# Patient Record
Sex: Male | Born: 1990 | Race: White | Hispanic: No | Marital: Married | Smoking: Former smoker
Health system: Southern US, Community
[De-identification: ages and names within clinical notes are randomized; demographics above are authoritative.]

## PROBLEM LIST (undated history)

## (undated) DIAGNOSIS — G4730 Sleep apnea, unspecified: Secondary | ICD-10-CM

## (undated) HISTORY — PX: VASECTOMY: SHX75

## (undated) HISTORY — DX: Sleep apnea, unspecified: G47.30

---

## 2017-01-15 ENCOUNTER — Other Ambulatory Visit (HOSPITAL_COMMUNITY): Payer: Self-pay | Admitting: General Surgery

## 2017-01-25 ENCOUNTER — Ambulatory Visit (HOSPITAL_COMMUNITY)
Admission: RE | Admit: 2017-01-25 | Discharge: 2017-01-25 | Disposition: A | Discharge status: Home / Self Care | Payer: 59 | Source: Ambulatory Visit | Attending: General Surgery | Admitting: General Surgery

## 2017-02-04 ENCOUNTER — Encounter: Payer: 59 | Attending: General Surgery | Admitting: Skilled Nursing Facility1

## 2017-02-04 ENCOUNTER — Encounter: Payer: Self-pay | Admitting: Skilled Nursing Facility1

## 2017-02-04 DIAGNOSIS — Z713 Dietary counseling and surveillance: Secondary | ICD-10-CM | POA: Diagnosis not present

## 2017-02-04 DIAGNOSIS — I10 Essential (primary) hypertension: Secondary | ICD-10-CM | POA: Diagnosis not present

## 2017-02-04 DIAGNOSIS — Z6839 Body mass index (BMI) 39.0-39.9, adult: Secondary | ICD-10-CM | POA: Insufficient documentation

## 2017-02-04 DIAGNOSIS — E669 Obesity, unspecified: Secondary | ICD-10-CM

## 2017-02-04 DIAGNOSIS — G4733 Obstructive sleep apnea (adult) (pediatric): Secondary | ICD-10-CM | POA: Diagnosis not present

## 2017-02-04 DIAGNOSIS — Z8249 Family history of ischemic heart disease and other diseases of the circulatory system: Secondary | ICD-10-CM | POA: Diagnosis not present

## 2017-02-04 DIAGNOSIS — Z8349 Family history of other endocrine, nutritional and metabolic diseases: Secondary | ICD-10-CM | POA: Diagnosis not present

## 2017-02-04 DIAGNOSIS — M545 Low back pain: Secondary | ICD-10-CM | POA: Insufficient documentation

## 2017-02-04 DIAGNOSIS — Z87891 Personal history of nicotine dependence: Secondary | ICD-10-CM | POA: Diagnosis not present

## 2017-02-04 DIAGNOSIS — E7800 Pure hypercholesterolemia, unspecified: Secondary | ICD-10-CM | POA: Diagnosis not present

## 2017-02-04 DIAGNOSIS — Z8371 Family history of colonic polyps: Secondary | ICD-10-CM | POA: Insufficient documentation

## 2017-02-04 DIAGNOSIS — K760 Fatty (change of) liver, not elsewhere classified: Secondary | ICD-10-CM | POA: Insufficient documentation

## 2017-02-04 DIAGNOSIS — Z811 Family history of alcohol abuse and dependence: Secondary | ICD-10-CM | POA: Insufficient documentation

## 2017-02-04 NOTE — Progress Notes (Signed)
Pre-Op Assessment Visit:  Pre-Operative Sleeve or RYGB Surgery  Medical Nutrition Therapy:  Appt start time: 1:58  End time:  2:49  Patient was seen on 02/04/2017 for Pre-Operative Nutrition Assessment. Assessment and [facility] coordinator on 02/04/2017.   Pt has yet to determine which surgery he wants preformed. Pt states he has adopted his niece and nephew and his wife has had the RYGB. Pt states he likes to drink randomly when he is feeling stressed and will drink 1 whole bottle of wine: Pt was educated on the research surrounding alcoholism at about 2 years from surgery. 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   Pt expectation of surgery: different mindset of food and make me not feel hungry and help control my portion sizes and live a healthier life   Pt expectation of Dietitian:  To help me with portion control and which foods to eat more of and less of  Start weight at [facility]: 271.1 BMI: 39.8  24 hr Dietary Recall: First Meal: skipped---eggs and bacon and hash browns or corn beef hash Snack:  Second Meal: sandwich and chips or fast food Snack:  Third Meal: spaghetti or chicken or steak and edemema and corn or peas  Snack: chips and dip or candy Beverages: diet soda, water  Encouraged to engage in 150 minutes of moderate physical activity including cardiovascular and weight baring weekly  Handouts given during visit include:  . Pre-Op Goals . Bariatric Surgery Protein Shakes During the appointment today the following Pre-Op Goals were reviewed with the patient: . Maintain or lose weight as instructed by your surgeon . Make healthy food choices . Begin to limit portion sizes . Limited concentrated sugars and fried foods . Keep fat/sugar in the single digits per serving on             food labels . Practice CHEWING your food  (aim for 30 chews per bite or until applesauce  consistency) . Practice not drinking 15 minutes before, during, and 30 minutes after each meal/snack . Avoid all carbonated beverages  . Avoid/limit caffeinated beverages  . Avoid all sugar-sweetened beverages . Consume 3 meals per day; eat every 3-5 hours . Make a list of non-food related activities . Aim for 64-100 ounces of FLUID daily  . Aim for at least 60-80 grams of PROTEIN daily . Look for a liquid protein source that contain ?15 g protein and ?5 g carbohydrate  (ex: shakes, drinks, shots)  -Follow diet recommendations listed below   Energy and Macronutrient Recomendations: Calories: 1600 Carbohydrate: 180 Protein: 120 Fat: 44  Demonstrated degree of understanding via:  Teach Back  Teaching Method Utilized:  Visual Auditory Hands on  Barriers to learning/adherence to lifestyle change: none identified  Patient to call the [facility] to enroll in Pre-Op and Post-Op Nutrition Education when surgery date is scheduled.

## 2017-03-08 ENCOUNTER — Encounter: Payer: 59 | Attending: General Surgery | Admitting: Skilled Nursing Facility1

## 2017-03-08 DIAGNOSIS — K760 Fatty (change of) liver, not elsewhere classified: Secondary | ICD-10-CM | POA: Diagnosis not present

## 2017-03-08 DIAGNOSIS — Z8249 Family history of ischemic heart disease and other diseases of the circulatory system: Secondary | ICD-10-CM | POA: Diagnosis not present

## 2017-03-08 DIAGNOSIS — E7800 Pure hypercholesterolemia, unspecified: Secondary | ICD-10-CM | POA: Insufficient documentation

## 2017-03-08 DIAGNOSIS — Z8349 Family history of other endocrine, nutritional and metabolic diseases: Secondary | ICD-10-CM | POA: Diagnosis not present

## 2017-03-08 DIAGNOSIS — Z87891 Personal history of nicotine dependence: Secondary | ICD-10-CM | POA: Diagnosis not present

## 2017-03-08 DIAGNOSIS — M545 Low back pain: Secondary | ICD-10-CM | POA: Insufficient documentation

## 2017-03-08 DIAGNOSIS — Z713 Dietary counseling and surveillance: Secondary | ICD-10-CM | POA: Diagnosis not present

## 2017-03-08 DIAGNOSIS — Z8371 Family history of colonic polyps: Secondary | ICD-10-CM | POA: Insufficient documentation

## 2017-03-08 DIAGNOSIS — Z6839 Body mass index (BMI) 39.0-39.9, adult: Secondary | ICD-10-CM | POA: Diagnosis not present

## 2017-03-08 DIAGNOSIS — G4733 Obstructive sleep apnea (adult) (pediatric): Secondary | ICD-10-CM | POA: Diagnosis not present

## 2017-03-08 DIAGNOSIS — E669 Obesity, unspecified: Secondary | ICD-10-CM

## 2017-03-08 DIAGNOSIS — Z811 Family history of alcohol abuse and dependence: Secondary | ICD-10-CM | POA: Diagnosis not present

## 2017-03-08 DIAGNOSIS — I10 Essential (primary) hypertension: Secondary | ICD-10-CM | POA: Diagnosis not present

## 2017-03-08 NOTE — Progress Notes (Signed)
RYGB  Assessment:   1st SWL Appointment.   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   [place]. Pt states he has started a blood pressure medication (lisinopril). Pt states he has cut his carbonated beverages to 2 a week. 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 Pt states he works second shift. Pt states he has been trying different protein water.   Start weight at [facility]: 271.1 Wt: 272.1 BMI: 39.95  MEDICATIONS: See List   DIETARY INTAKE:  24-hr recall:  B ( AM): skipped Snk ( AM):  L ( PM): lunch meat and cheese roll up and nuts Snk ( PM): yogurt and strawberries  D ( PM): tacos with cheese shell instead of tortilla  Snk ( PM):  Beverages: water with flavorings, some clear protein drinks, diet soda, 1-2 glasses of wine  Usual physical activity: trying to do 30 minutes a day, trying to move more  Diet to Follow: 1600 calories 180 g carbohydrates 120 g protein 44 g fat   Nutritional Diagnosis:  [place]-3.3 Overweight/obesity related to past poor dietary habits and physical inactivity as evidenced by patient w/ planned RYGB surgery following dietary guidelines for continued weight loss.    Intervention:  Nutrition counseling for upcoming Bariatric Surgery. Goals: -Encouraged to engage in 150 minutes of moderate physical activity including cardiovascular and weight baring weekly -Have something within the first hour of waking  -Avoid alcohol -Avoid caffeine   Teaching Method Utilized:  Visual Auditory Hands on  Barriers to learning/adherence to lifestyle change: none identified   Demonstrated degree of understanding via:  Teach Back   Monitoring/Evaluation:  Dietary intake, exercise, and body weight prn.

## 2017-03-08 NOTE — Patient Instructions (Addendum)
-  Have something within the first hour of waking   -Avoid alcohol  -Avoid caffeine

## 2017-04-07 ENCOUNTER — Encounter: Payer: Self-pay | Admitting: Skilled Nursing Facility1

## 2017-04-07 ENCOUNTER — Encounter: Payer: 59 | Attending: General Surgery | Admitting: Skilled Nursing Facility1

## 2017-04-07 DIAGNOSIS — M545 Low back pain: Secondary | ICD-10-CM | POA: Diagnosis not present

## 2017-04-07 DIAGNOSIS — Z8371 Family history of colonic polyps: Secondary | ICD-10-CM | POA: Diagnosis not present

## 2017-04-07 DIAGNOSIS — Z87891 Personal history of nicotine dependence: Secondary | ICD-10-CM | POA: Insufficient documentation

## 2017-04-07 DIAGNOSIS — K760 Fatty (change of) liver, not elsewhere classified: Secondary | ICD-10-CM | POA: Insufficient documentation

## 2017-04-07 DIAGNOSIS — Z811 Family history of alcohol abuse and dependence: Secondary | ICD-10-CM | POA: Insufficient documentation

## 2017-04-07 DIAGNOSIS — Z6839 Body mass index (BMI) 39.0-39.9, adult: Secondary | ICD-10-CM | POA: Insufficient documentation

## 2017-04-07 DIAGNOSIS — E669 Obesity, unspecified: Secondary | ICD-10-CM

## 2017-04-07 DIAGNOSIS — E7800 Pure hypercholesterolemia, unspecified: Secondary | ICD-10-CM | POA: Insufficient documentation

## 2017-04-07 DIAGNOSIS — Z713 Dietary counseling and surveillance: Secondary | ICD-10-CM | POA: Insufficient documentation

## 2017-04-07 DIAGNOSIS — G4733 Obstructive sleep apnea (adult) (pediatric): Secondary | ICD-10-CM | POA: Insufficient documentation

## 2017-04-07 DIAGNOSIS — Z8349 Family history of other endocrine, nutritional and metabolic diseases: Secondary | ICD-10-CM | POA: Diagnosis not present

## 2017-04-07 DIAGNOSIS — Z8249 Family history of ischemic heart disease and other diseases of the circulatory system: Secondary | ICD-10-CM | POA: Insufficient documentation

## 2017-04-07 DIAGNOSIS — I10 Essential (primary) hypertension: Secondary | ICD-10-CM | POA: Insufficient documentation

## 2017-04-07 NOTE — Progress Notes (Signed)
RYGB  Assessment:   2nd SWL Appointment.   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   Pt states he [place] in [place]. Pt states he has started a blood pressure medication (lisinopril). Pt states he has cut his carbonated beverages to 2 a week. 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 Pt states he works second shift.   Pt arrives having gained about 2 pounds. Pt states he has not had any alcohol. Pt states he stills struggles getting in breakfast. Pt states he has been using smaller plates to help with portion sizes. Pt state he needs to work on not feeling guilty about the food he eats. Pt states he has been working on less carbonated drinks. Pt states he has tried some protein shakes that he likes okay.   Start weight at [facility]: 271.1 Wt: 274 BMI: 40.32  MEDICATIONS: See List   DIETARY INTAKE:  24-hr recall:  B ( AM): skipped---cheese stick yogurt and lunch meat Snk ( AM):  L ( PM): cheese shell taco with meat Snk ( PM): yogurt and strawberries---almonds or nuts D ( PM): hamburger helper   Snk ( PM):  Beverages: water with flavorings, some clear protein drinks, diet soda, fairlife milk or carb master milk  Usual physical activity: trying to do 30 minutes a day, trying to move more  Diet to Follow: 1600 calories 180 g carbohydrates 120 g protein 44 g fat   Nutritional Diagnosis:  [place]-3.3 Overweight/obesity related to past poor dietary habits and physical inactivity as evidenced by patient w/ planned RYGB surgery following dietary guidelines for continued weight loss.    Intervention:  Nutrition counseling for upcoming Bariatric Surgery. Goals: -Encouraged to engage in 150 minutes of moderate physical activity including cardiovascular and weight baring weekly -Have something within the first hour of waking  -Avoid carbonation  -Work on well balanced meals: use the idea sheet  Teaching Method Utilized:  Visual Auditory Hands on  Barriers to  learning/adherence to lifestyle change: none identified   Demonstrated degree of understanding via:  Teach Back   Monitoring/Evaluation:  Dietary intake, exercise, and body weight prn.

## 2017-05-05 ENCOUNTER — Encounter: Payer: 59 | Attending: General Surgery | Admitting: Skilled Nursing Facility1

## 2017-05-05 ENCOUNTER — Encounter: Payer: Self-pay | Admitting: Skilled Nursing Facility1

## 2017-05-05 DIAGNOSIS — K760 Fatty (change of) liver, not elsewhere classified: Secondary | ICD-10-CM | POA: Diagnosis not present

## 2017-05-05 DIAGNOSIS — M545 Low back pain: Secondary | ICD-10-CM | POA: Diagnosis not present

## 2017-05-05 DIAGNOSIS — E7800 Pure hypercholesterolemia, unspecified: Secondary | ICD-10-CM | POA: Diagnosis not present

## 2017-05-05 DIAGNOSIS — E669 Obesity, unspecified: Secondary | ICD-10-CM

## 2017-05-05 DIAGNOSIS — Z8371 Family history of colonic polyps: Secondary | ICD-10-CM | POA: Insufficient documentation

## 2017-05-05 DIAGNOSIS — G4733 Obstructive sleep apnea (adult) (pediatric): Secondary | ICD-10-CM | POA: Insufficient documentation

## 2017-05-05 DIAGNOSIS — Z811 Family history of alcohol abuse and dependence: Secondary | ICD-10-CM | POA: Diagnosis not present

## 2017-05-05 DIAGNOSIS — Z8349 Family history of other endocrine, nutritional and metabolic diseases: Secondary | ICD-10-CM | POA: Diagnosis not present

## 2017-05-05 DIAGNOSIS — I10 Essential (primary) hypertension: Secondary | ICD-10-CM | POA: Insufficient documentation

## 2017-05-05 DIAGNOSIS — Z87891 Personal history of nicotine dependence: Secondary | ICD-10-CM | POA: Diagnosis not present

## 2017-05-05 DIAGNOSIS — Z8249 Family history of ischemic heart disease and other diseases of the circulatory system: Secondary | ICD-10-CM | POA: Insufficient documentation

## 2017-05-05 DIAGNOSIS — Z6839 Body mass index (BMI) 39.0-39.9, adult: Secondary | ICD-10-CM | POA: Insufficient documentation

## 2017-05-05 DIAGNOSIS — Z713 Dietary counseling and surveillance: Secondary | ICD-10-CM | POA: Insufficient documentation

## 2017-05-05 NOTE — Progress Notes (Signed)
RYGB  Assessment:  3rd SWL Appointment.   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   Pt [place]. Pt states he has started a blood pressure medication (lisinopril). Pt states he has cut his carbonated beverages to 2 a week. 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 Pt states he works second shift.   Pt arrives having lost about 1 pound. [place] Pt states he is Ready to embrace this new life style. Pt states he has 5 children.   Start weight at [facility]: 271.1 Wt: 273.4 BMI: 40.14  MEDICATIONS: See List   DIETARY INTAKE:  24-hr recall:  B ( AM): yogurt or p3 Snk ( AM): nuts L ( PM): cheese shell taco with meat---carrots and ham or chicken  Snk ( PM): potato wedges with ham D ( PM): chicken and bacon or roast beef with carrots and potatoes  Snk ( PM):  Beverages: water with flavorings, propel, some clear protein drinks, diet soda, fairlife milk or carb master milk  Usual physical activity: trying to do 30 minutes a day and [id number] steps   Diet to Follow: 1600 calories 180 g carbohydrates 120 g protein 44 g fat   Nutritional Diagnosis:  [place]-3.3 Overweight/obesity related to past poor dietary habits and physical inactivity as evidenced by patient w/ planned RYGB surgery following dietary guidelines for continued weight loss.    Intervention:  Nutrition counseling for upcoming Bariatric Surgery. Goals: -Encouraged to engage in 150 minutes of moderate physical activity including cardiovascular and weight baring weekly -Keep up the great work!  Teaching Method Utilized:  Visual Auditory Hands on  Barriers to learning/adherence to lifestyle change: none identified   Demonstrated degree of understanding via:  Teach Back   Monitoring/Evaluation:  Dietary intake, exercise, and body weight prn.

## 2017-05-11 ENCOUNTER — Ambulatory Visit (INDEPENDENT_AMBULATORY_CARE_PROVIDER_SITE_OTHER): Payer: 59 | Admitting: Psychiatry

## 2017-05-11 DIAGNOSIS — F509 Eating disorder, unspecified: Secondary | ICD-10-CM | POA: Diagnosis not present

## 2017-05-17 ENCOUNTER — Encounter: Payer: 59 | Attending: General Surgery | Admitting: Registered"

## 2017-05-17 DIAGNOSIS — E669 Obesity, unspecified: Secondary | ICD-10-CM

## 2017-05-17 DIAGNOSIS — Z87891 Personal history of nicotine dependence: Secondary | ICD-10-CM | POA: Diagnosis not present

## 2017-05-17 DIAGNOSIS — K760 Fatty (change of) liver, not elsewhere classified: Secondary | ICD-10-CM | POA: Insufficient documentation

## 2017-05-17 DIAGNOSIS — Z811 Family history of alcohol abuse and dependence: Secondary | ICD-10-CM | POA: Insufficient documentation

## 2017-05-17 DIAGNOSIS — I10 Essential (primary) hypertension: Secondary | ICD-10-CM | POA: Diagnosis not present

## 2017-05-17 DIAGNOSIS — M545 Low back pain: Secondary | ICD-10-CM | POA: Diagnosis not present

## 2017-05-17 DIAGNOSIS — Z8249 Family history of ischemic heart disease and other diseases of the circulatory system: Secondary | ICD-10-CM | POA: Diagnosis not present

## 2017-05-17 DIAGNOSIS — Z6839 Body mass index (BMI) 39.0-39.9, adult: Secondary | ICD-10-CM | POA: Insufficient documentation

## 2017-05-17 DIAGNOSIS — Z713 Dietary counseling and surveillance: Secondary | ICD-10-CM | POA: Insufficient documentation

## 2017-05-17 DIAGNOSIS — E7800 Pure hypercholesterolemia, unspecified: Secondary | ICD-10-CM | POA: Diagnosis not present

## 2017-05-17 DIAGNOSIS — Z8371 Family history of colonic polyps: Secondary | ICD-10-CM | POA: Insufficient documentation

## 2017-05-17 DIAGNOSIS — G4733 Obstructive sleep apnea (adult) (pediatric): Secondary | ICD-10-CM | POA: Diagnosis not present

## 2017-05-17 DIAGNOSIS — Z8349 Family history of other endocrine, nutritional and metabolic diseases: Secondary | ICD-10-CM | POA: Insufficient documentation

## 2017-05-17 NOTE — Progress Notes (Signed)
  Pre-Operative Nutrition Class:  Appt start time: 8:15   End time:  9:15.  Patient was seen on 05/17/2017 for Pre-Operative Bariatric Surgery Education [facility].   Surgery date: TBD Surgery type: RYGB Start weight at [facility]: 271.1 Weight today: 280.0  Samples given per MNT protocol. Patient educated on appropriate usage: Bariatric Advantage Multivitamin Lot # [id number] Exp: 09/2017  Bariatric Advantage Calcium Citrate Lot # [id number] Exp: 12/25/2017  Bariatric Advantage Calcium Citrate Lot # [id number] Exp: 08/08//2019  Renee Pain Protein Shake Lot # [id number] Exp: 12/24/2017  The following the learning objectives were met by the patient during this course:  Identify Pre-Op Dietary Goals and will begin 2 weeks pre-operatively  Identify appropriate sources of fluids and proteins   State protein recommendations and appropriate sources pre and post-operatively  Identify Post-Operative Dietary Goals and will follow for 2 weeks post-operatively  Identify appropriate multivitamin and calcium sources  Describe the need for physical activity post-operatively and will follow MD recommendations  State when to call healthcare provider regarding medication questions or post-operative complications  Handouts given during class include:  Pre-Op Bariatric Surgery Diet Handout  Protein Shake Handout  Post-Op Bariatric Surgery Nutrition Handout  [facility] Program Information Flyer  Support Group Information Flyer  [facility] Bariatric Supplements Price List  Follow-Up Plan: Patient will follow-up at [facility] 2 weeks post operatively for diet advancement per MD.

## 2017-05-27 ENCOUNTER — Ambulatory Visit (INDEPENDENT_AMBULATORY_CARE_PROVIDER_SITE_OTHER): Payer: 59 | Admitting: Psychiatry

## 2017-05-27 DIAGNOSIS — F509 Eating disorder, unspecified: Secondary | ICD-10-CM | POA: Diagnosis not present

## 2017-06-24 ENCOUNTER — Ambulatory Visit: Payer: Self-pay | Admitting: General Surgery

## 2017-06-24 NOTE — Patient Instructions (Addendum)
Jay Castro  06/24/2017   Your procedure is scheduled on: 06-29-17   Report to [facility] Main  Entrance Report to Admitting at 5:30 AM    Call this number if you have problems the morning of surgery [phone]   Remember: MORNING OF SURGERY DRINK:  1SHAKE BEFORE YOU LEAVE HOME, DRINK ALL OF THE SHAKE AT ONE TIME.   NO SOLID FOOD AFTER 600 PM THE NIGHT BEFORE YOUR SURGERY. YOU MAY DRINK CLEAR FLUIDS. THE SHAKE YOU DRINK BEFORE YOU LEAVE HOME WILL BE THE LAST FLUIDS YOU DRINK BEFORE SURGERY.  PAIN IS EXPECTED AFTER SURGERY AND WILL NOT BE COMPLETELY ELIMINATED. AMBULATION AND TYLENOL WILL HELP REDUCE INCISIONAL AND GAS PAIN. MOVEMENT IS KEY!  YOU ARE EXPECTED TO BE OUT OF BED WITHIN 4 HOURS OF ADMISSION TO YOUR PATIENT ROOM.  SITTING IN THE RECLINER THROUGHOUT THE DAY IS IMPORTANT FOR DRINKING FLUIDS AND MOVING GAS THROUGHOUT THE GI TRACT.  COMPRESSION STOCKINGS SHOULD BE WORN [facility] STAY UNLESS YOU ARE WALKING.   INCENTIVE SPIROMETER SHOULD BE USED EVERY HOUR WHILE AWAKE TO DECREASE POST-OPERATIVE COMPLICATIONS SUCH AS PNEUMONIA.  WHEN DISCHARGED HOME, IT IS IMPORTANT TO CONTINUE TO WALK EVERY HOUR AND USE THE INCENTIVE SPIROMETER EVERY HOUR.       CLEAR LIQUID DIET   Foods Allowed                                                                     Foods Excluded  Coffee and tea, regular and decaf                             liquids that you cannot  Plain Jell-O in any flavor                                             see through such as: Fruit ices (not with fruit pulp)                                     milk, soups, orange juice  Iced Popsicles                                    All solid food Carbonated beverages, regular and diet                                    Cranberry, grape and apple juices Sports drinks like Gatorade Lightly seasoned clear broth or consume(fat free) Sugar, honey syrup  Sample Menu Breakfast                                 Lunch  Supper Cranberry juice                    Beef broth                            Chicken broth Jell-O                                     Grape juice                           Apple juice Coffee or tea                        Jell-O                                      Popsicle                                                Coffee or tea                        Coffee or tea  _____________________________________________________________________         Take these medicines the morning of surgery with A SIP OF WATER: None                                You may not have any metal on your body including hair pins and              piercings  Do not wear jewelry, lotions, powders or deodorant             Do not wear nail polish.  Do not shave  48 hours prior to surgery.              Do not bring valuables to the hospital. [facility] IS NOT             RESPONSIBLE   FOR VALUABLES.  Contacts, dentures or bridgework may not be worn into surgery.  Leave suitcase in the car. After surgery it may be brought to your room.                Please bring your mask and tubing for your CPAP machine              Please read over the following fact sheets you were given: _____________________________________________________________________           [facility] - Preparing for Surgery Before surgery, you can play an important role.  Because skin is not sterile, your skin needs to be as free of germs as possible.  You can reduce the number of germs on your skin by washing with CHG (chlorahexidine gluconate) soap before surgery.  CHG is an antiseptic cleaner which kills germs and bonds with the skin to continue killing germs even after washing. Please DO NOT use if you have an allergy to CHG or antibacterial soaps.  If your skin becomes reddened/irritated stop using the CHG  and inform your nurse when you arrive at Short Stay. Do not shave  (including legs and underarms) for at least 48 hours prior to the first CHG shower.  You may shave your face/neck. Please follow these instructions carefully:  1.  Shower with CHG Soap the night before surgery and the  morning of Surgery.  2.  If you choose to wash your hair, wash your hair first as usual with your  normal  shampoo.  3.  After you shampoo, rinse your hair and body thoroughly to remove the  shampoo.                           4.  Use CHG as you would any other liquid soap.  You can apply chg directly  to the skin and wash                       Gently with a scrungie or clean washcloth.  5.  Apply the CHG Soap to your body ONLY FROM THE NECK DOWN.   Do not use on face/ open                           Wound or open sores. Avoid contact with eyes, ears mouth and genitals (private parts).                       Wash face,  Genitals (private parts) with your normal soap.             6.  Wash thoroughly, paying special attention to the area where your surgery  will be performed.  7.  Thoroughly rinse your body with warm water from the neck down.  8.  DO NOT shower/wash with your normal soap after using and rinsing off  the CHG Soap.                9.  Pat yourself dry with a clean towel.            10.  Wear clean pajamas.            11.  Place clean sheets on your bed the night of your first shower and do not  sleep with pets. Day of Surgery : Do not apply any lotions/deodorants the morning of surgery.  Please wear clean clothes to the hospital/surgery center.  FAILURE TO FOLLOW THESE INSTRUCTIONS MAY RESULT IN THE CANCELLATION OF YOUR SURGERY PATIENT SIGNATURE_________________________________  NURSE SIGNATURE__________________________________  ________________________________________________________________________  WHAT IS A BLOOD TRANSFUSION? Blood Transfusion Information  A transfusion is the replacement of blood or some of its parts. Blood is made up of multiple cells which  provide different functions.  Red blood cells carry oxygen and are used for blood loss replacement.  White blood cells fight against infection.  Platelets control bleeding.  Plasma helps clot blood.  Other blood products are available for specialized needs, such as hemophilia or other clotting disorders. BEFORE THE TRANSFUSION  Who gives blood for transfusions?   Healthy volunteers who are fully evaluated to make sure their blood is safe. This is blood bank blood. Transfusion therapy is the safest it has ever been in the practice of medicine. Before blood is taken from a donor, a complete history is taken to make sure that person has no history of diseases nor engages in risky social behavior (examples are intravenous drug  use or sexual activity with multiple partners). The donor's travel history is screened to minimize risk of transmitting infections, such as malaria. The donated blood is tested for signs of infectious diseases, such as HIV and hepatitis. The blood is then tested to be sure it is compatible with you in order to minimize the chance of a transfusion reaction. If you or a relative donates blood, this is often done in anticipation of surgery and is not appropriate for emergency situations. It takes many days to process the donated blood. RISKS AND COMPLICATIONS Although transfusion therapy is very safe and saves many lives, the main dangers of transfusion include:   Getting an infectious disease.  Developing a transfusion reaction. This is an allergic reaction to something in the blood you were given. Every precaution is taken to prevent this. The decision to have a blood transfusion has been considered carefully by your caregiver before blood is given. Blood is not given unless the benefits outweigh the risks. AFTER THE TRANSFUSION  Right after receiving a blood transfusion, you will usually feel much better and more energetic. This is especially true if your red blood cells  have gotten low (anemic). The transfusion raises the level of the red blood cells which carry oxygen, and this usually causes an energy increase.  The nurse administering the transfusion will monitor you carefully for complications. HOME CARE INSTRUCTIONS  No special instructions are needed after a transfusion. You may find your energy is better. Speak with your caregiver about any limitations on activity for underlying diseases you may have. SEEK MEDICAL CARE IF:   Your condition is not improving after your transfusion.  You develop redness or irritation at the intravenous (IV) site. SEEK IMMEDIATE MEDICAL CARE IF:  Any of the following symptoms occur over the next 12 hours:  Shaking chills.  You have a temperature by mouth above 102 F (38.9 C), not controlled by medicine.  Chest, back, or muscle pain.  People around you feel you are not acting correctly or are confused.  Shortness of breath or difficulty breathing.  Dizziness and fainting.  You get a rash or develop hives.  You have a decrease in urine output.  Your urine turns a dark color or changes to pink, red, or brown. Any of the following symptoms occur over the next 10 days:  You have a temperature by mouth above 102 F (38.9 C), not controlled by medicine.  Shortness of breath.  Weakness after normal activity.  The white part of the eye turns yellow (jaundice).  You have a decrease in the amount of urine or are urinating less often.  Your urine turns a dark color or changes to pink, red, or brown. Document Released: 02/21/2000 Document Revised: 05/18/2011 Document Reviewed: 10/10/2007 [facility] Patient Information 2014 [place], [place].  _______________________________________________________________________

## 2017-06-24 NOTE — Progress Notes (Addendum)
01-25-17 (Epic) EKG, CXR

## 2017-06-24 NOTE — H&P (View-Only) (Signed)
Jay Castro Castro Documented: 06/10/2017 9:46 AM Location: [facility] Patient #: [id number] DOB: 12-31-1990 Married / Language: Jay Castro Castro / Race: White Male   History of Present Illness Jay Castro Castro M. Lige Lakeman MD; 06/10/2017 11:31 AM) The patient is a 27 year old male who presents for a bariatric surgery evaluation. He comes in today after completing his supervised weight loss. He denies any medical changes other than being started on medication for hypertension. He denies any trips to the emergency room or hospital. He did undergo an echocardiogram in follow-up to an EKG. He states the echocardiogram was normal. His upper GI and chest x-ray were unremarkable. His vitamin labs were normal. He is still using CPAP. He now endorses some occasional heartburn but no chest tightness, chest pressure, shortness of breath, orthopnea, peripheral edema for paroxysmal nocturnal dyspnea. He reports a good appetite. He denies any tobacco or drug use. He has also been evaluated by psychology and felt to be a good candidate  01/2017 He is referred by Dr Lafayette Dragon for evaluation of weight loss surgery. I recently operated on his wife for weight loss surgery. He completed our Futures trader. He is interested in sleeve gastrectomy. He is interested in improving his sleep apnea and overall improving his health. Despite numerous attempts for sustained weight loss is been unsuccessful. He has tried Atkins, phentermine, and Belviq-all without any long-term success.  His comorbidities include obstructive sleep apnea on CPAP, elevated LDL, elevated blood pressure, fatty liver  He denies any chest pain, chest pressure, shortness of breath, orthopnea, paroxysmal nocturnal dyspnea, dyspnea on exertion peripheral edema or personal or family history of blood clots. He can get short of breath with physical activity but not with daily routine activities. He does use CPAP for sleep apnea. He states that he had an abdominal  ultrasound that showed that he had a fatty liver. This was triggered because of a mildly elevated bilirubin level of 1.6. He denies any abdominal pain. He denies any melena or hematochezia. He denies any prior abdominal surgery. He denies any heartburn, reflux or regurgitation. He denies any dysuria or hematuria. He does have some central low back pain but denies sciatica. He denies any diagnosis of diabetes. He denies any issues with headaches, blurry vision or vision loss. He is a former smoker. He drinks alcohol on occasion but not routinely.  He had blood work done on September 25. He had a normal CBC, comprehensive metabolic panel, and lipid panel. However his LDL was elevated at 108  Otherwise a 12 point comprehensive review of systems was performed and all systems are negative except for what is mentioned in HPI   Problem List/Past Medical Jay Castro Castro M. Jay Castro Campanile, MD; 06/10/2017 11:32 AM) FATTY LIVER (K76.0)  MORBID OBESITY (E66.01)   Past Surgical History Jay Castro Castro M. Jay Castro Campanile, MD; 06/10/2017 11:32 AM) Vasectomy   Diagnostic Studies History Jay Castro Castro. Jay Castro Campanile, MD; 06/10/2017 11:32 AM) Colonoscopy  never  Allergies Jay Castro Castro; 06/10/2017 9:46 AM) No Known Allergies [01/13/2017]: Allergies Reconciled   Medication History Jay Castro Castro M. Jay Castro Campanile, MD; 06/10/2017 11:32 AM) Lisinopril (10MG  Tablet, Oral) Active. Doxycycline Hyclate (100MG  Tablet, Oral) Active. Medications Reconciled OxyCODONE HCl (5MG /5ML Solution, 5-10 Oral every four hours, as needed, Taken starting 06/10/2017) Active. Pantoprazole Sodium (40MG  Tablet DR, 1 (one) Oral daily, Taken starting 06/10/2017) Active. Ondansetron (4MG  Tablet Disint, 1 (one) Oral every six hours, as needed, Taken starting 06/10/2017) Active.  Social History Jay Castro Castro M. Jay Castro Campanile, MD; 06/10/2017 11:32 AM) Alcohol use  Occasional alcohol use. No caffeine use  No drug use  Tobacco use  Former smoker.  Family History Jay Castro Castro(Cowan Pilar M. Jay Castro CampanileWilson, MD; 06/10/2017  11:32 AM) Alcohol Abuse  Mother. Colon Polyps  Father. Family history unknown  First Degree Relatives  Hypertension  Father. Thyroid problems  Father.  Other Problems Jay Castro Castro(Jay Castro Castro M. Jay Castro CampanileWilson, MD; 06/10/2017 11:32 AM) Anxiety Disorder  Depression  Gastroesophageal Reflux Disease  Hemorrhoids  OBSTRUCTIVE SLEEP APNEA ON CPAP (G47.33)  ELEVATED LDL CHOLESTEROL LEVEL (E78.00)  BACK PAIN, LUMBOSACRAL (M54.5)  HYPERTENSION, ESSENTIAL (I10)     Review of Systems Jay Castro Castro(Jay Castro Castro M. Jay Castro Carrol MD; 06/10/2017 11:31 AM) General Present- Fatigue. Not Present- Appetite Loss, Chills, Fever, Night Sweats, Weight Gain and Weight Loss. Skin Not Present- Change in Wart/Mole, Dryness, Hives, Jaundice, New Lesions, Non-Healing Wounds, Rash and Ulcer. HEENT Not Present- Earache, Hearing Loss, Hoarseness, Nose Bleed, Oral Ulcers, Ringing in the Ears, Seasonal Allergies, Sinus Pain, Sore Throat, Visual Disturbances, Wears glasses/contact lenses and Yellow Eyes. Respiratory Present- Snoring. Not Present- Bloody sputum, Chronic Cough, Difficulty Breathing and Wheezing. Breast Not Present- Breast Mass, Breast Pain, Nipple Discharge and Skin Changes. Cardiovascular Not Present- Chest Pain, Difficulty Breathing Lying Down, Leg Cramps, Palpitations, Rapid Heart Rate, Shortness of Breath and Swelling of Extremities. Gastrointestinal Present- Excessive gas. Not Present- Abdominal Pain, Bloating, Bloody Stool, Change in Bowel Habits, Chronic diarrhea, Constipation, Difficulty Swallowing, Gets full quickly at meals, Hemorrhoids, Indigestion, Nausea, Rectal Pain and Vomiting. Male Genitourinary Not Present- Blood in Urine, Change in Urinary Stream, Frequency, Impotence, Nocturia, Painful Urination, Urgency and Urine Leakage. All other systems negative  Vitals Jay Castro Castro(Jay Castro Castro; 06/10/2017 9:47 AM) 06/10/2017 9:47 AM Weight: 268.5 lb Height: 69.25in Body Surface Area: 2.35 m Body Mass Index: 39.36 kg/m  Temp.: 98.50F(Oral)   Pulse: 91 (Regular)  BP: 128/92 (Sitting, Left Arm, Standard)       Physical Exam Jay Castro Castro(Tandy Lewin M. Jamye Balicki MD; 06/10/2017 11:31 AM) General Mental Status-Alert. General Appearance-Consistent with stated age. Hydration-Well hydrated. Voice-Normal. Note: morbidly obese evenly distributed   Head and Neck Head-normocephalic, atraumatic with no lesions or palpable masses. Trachea-midline. Thyroid Gland Characteristics - normal size and consistency.  Eye Eyeball - Bilateral-Extraocular movements intact. Sclera/Conjunctiva - Bilateral-No scleral icterus.  ENMT Ears -Note: normal external ears.  Mouth and Throat -Note: lips intact.   Chest and Lung Exam Chest and lung exam reveals -quiet, even and easy respiratory effort with no use of accessory muscles and on auscultation, normal breath sounds, no adventitious sounds and normal vocal resonance. Inspection Chest Wall - Normal. Back - normal.  Breast - Did not examine.  Cardiovascular Cardiovascular examination reveals -normal heart sounds, regular rate and rhythm with no murmurs and normal pedal pulses bilaterally.  Abdomen Inspection Inspection of the abdomen reveals - No Hernias. Skin - Scar - no surgical scars. Palpation/Percussion Palpation and Percussion of the abdomen reveal - Soft, Non Tender, No Rebound tenderness, No Rigidity (guarding) and No hepatosplenomegaly. Auscultation Auscultation of the abdomen reveals - Bowel sounds normal.  Peripheral Vascular Upper Extremity Palpation - Pulses bilaterally normal.  Neurologic Neurologic evaluation reveals -alert and oriented x 3 with no impairment of recent or remote memory. Mental Status-Normal.  Neuropsychiatric The patient's mood and affect are described as -normal. Judgment and Insight-insight is appropriate concerning matters relevant to self.  Musculoskeletal Normal Exam - Left-Upper Extremity Strength Normal and Lower  Extremity Strength Normal. Normal Exam - Right-Upper Extremity Strength Normal and Lower Extremity Strength Normal.  Lymphatic Head & Neck  General Head & Neck Lymphatics: Bilateral - Description - Normal. Axillary - Did not examine.  Femoral & Inguinal - Did not examine.    Assessment & Plan Jay Castro Castro M. Corbet Hanley MD; 06/10/2017 11:32 AM) Ronny Bacon OBESITY (E66.01) Impression: At our initial visit the patient was undecided about sleeve gastrectomy or Roux-en-Y gastric bypass. He has decided to pursue Roux-en-Y gastric bypass. It appeals to him because of the longer track record and less incidence of reflux long-term compared to the sleeve gastrectomy. I believe the patient is still a candidate for laparoscopic Roux-en-Y gastric bypass. He has completed his required supervised weight loss. We rediscussed the typical operative steps as well as the typical recovery after gastric bypass surgery. He declined to speak about risk of surgery again. We had an extensive conversation at his initial visit regarding immediate as well as long-term risk after Roux-en-Y gastric bypass. We also discussed the importance of vitamin supplementation. All of his questions were asked and answered. He was given his postoperative prescriptions today. Current Plans Pt Education - EMW_preopbariatric Started OxyCODONE HCl 5 MG/5ML Oral Solution, 5-10 Milliliter every four hours, as needed, 75 Milliliter, 06/10/2017, No Refill. Started Pantoprazole Sodium 40 MG Oral Tablet Delayed Release, 1 (one) Tablet daily, #30, 06/10/2017, Ref. x2. Started Ondansetron 4 MG Oral Tablet Disintegrating, 1 (one) Tablet every six hours, as needed, #15, 06/10/2017, No Refill. FATTY LIVER (K76.0) OBSTRUCTIVE SLEEP APNEA ON CPAP (G47.33) HYPERTENSION, ESSENTIAL (I10) BACK PAIN, LUMBOSACRAL (M54.5)  Jay Castro Castro. Jay Castro Campanile, MD, FACS General, Bariatric, & Minimally Invasive Surgery [facility], [place]

## 2017-06-24 NOTE — H&P (Signed)
Jay Castro Documented: 06/10/2017 9:46 AM Location: [facility] Patient #: [id number] DOB: 12-31-1990 Married / Language: [name] / Race: White Male   History of Present Illness Jay Areola M. Jameela Michna Castro; 06/10/2017 11:31 AM) The patient is a 27 year old male who presents for a bariatric surgery evaluation. He comes in today after completing his supervised weight loss. He denies any medical changes other than being started on medication for hypertension. He denies any trips to the emergency room or hospital. He did undergo an echocardiogram in follow-up to an EKG. He states the echocardiogram was normal. His upper GI and chest x-ray were unremarkable. His vitamin labs were normal. He is still using CPAP. He now endorses some occasional heartburn but no chest tightness, chest pressure, shortness of breath, orthopnea, peripheral edema for paroxysmal nocturnal dyspnea. He reports a good appetite. He denies any tobacco or drug use. He has also been evaluated by psychology and felt to be a good candidate  01/2017 He is referred by Dr Lafayette Dragon for evaluation of weight loss surgery. I recently operated on his wife for weight loss surgery. He completed our Futures trader. He is interested in sleeve gastrectomy. He is interested in improving his sleep apnea and overall improving his health. Despite numerous attempts for sustained weight loss is been unsuccessful. He has tried Atkins, phentermine, and Belviq-all without any long-term success.  His comorbidities include obstructive sleep apnea on CPAP, elevated LDL, elevated blood pressure, fatty liver  He denies any chest pain, chest pressure, shortness of breath, orthopnea, paroxysmal nocturnal dyspnea, dyspnea on exertion peripheral edema or personal or family history of blood clots. He can get short of breath with physical activity but not with daily routine activities. He does use CPAP for sleep apnea. He states that he had an abdominal  ultrasound that showed that he had a fatty liver. This was triggered because of a mildly elevated bilirubin level of 1.6. He denies any abdominal pain. He denies any melena or hematochezia. He denies any prior abdominal surgery. He denies any heartburn, reflux or regurgitation. He denies any dysuria or hematuria. He does have some central low back pain but denies sciatica. He denies any diagnosis of diabetes. He denies any issues with headaches, blurry vision or vision loss. He is a former smoker. He drinks alcohol on occasion but not routinely.  He had blood work done on September 25. He had a normal CBC, comprehensive metabolic panel, and lipid panel. However his LDL was elevated at 108  Otherwise a 12 point comprehensive review of systems was performed and all systems are negative except for what is mentioned in HPI   Problem List/Past Medical Jay Areola M. Andrey Campanile, Castro; 06/10/2017 11:32 AM) FATTY LIVER (K76.0)  MORBID OBESITY (E66.01)   Past Surgical History Jay Areola M. Andrey Campanile, Castro; 06/10/2017 11:32 AM) Vasectomy   Diagnostic Studies History Jay Sella. Andrey Campanile, Castro; 06/10/2017 11:32 AM) Colonoscopy  never  Allergies Jay Castro; 06/10/2017 9:46 AM) No Known Allergies [01/13/2017]: Allergies Reconciled   Medication History Jay Areola M. Andrey Campanile, Castro; 06/10/2017 11:32 AM) Lisinopril (10MG  Tablet, Oral) Active. Doxycycline Hyclate (100MG  Tablet, Oral) Active. Medications Reconciled OxyCODONE HCl (5MG /5ML Solution, 5-10 Oral every four hours, as needed, Taken starting 06/10/2017) Active. Pantoprazole Sodium (40MG  Tablet DR, 1 (one) Oral daily, Taken starting 06/10/2017) Active. Ondansetron (4MG  Tablet Disint, 1 (one) Oral every six hours, as needed, Taken starting 06/10/2017) Active.  Social History Jay Areola M. Andrey Campanile, Castro; 06/10/2017 11:32 AM) Alcohol use  Occasional alcohol use. No caffeine use  No drug use  Tobacco use  Former smoker.  Family History Jay Castro; 06/10/2017  11:32 AM) Alcohol Abuse  Mother. Colon Polyps  Father. Family history unknown  First Degree Relatives  Hypertension  Father. Thyroid problems  Father.  Other Problems Jay Castro; 06/10/2017 11:32 AM) Anxiety Disorder  Depression  Gastroesophageal Reflux Disease  Hemorrhoids  OBSTRUCTIVE SLEEP APNEA ON CPAP (G47.33)  ELEVATED LDL CHOLESTEROL LEVEL (E78.00)  BACK PAIN, LUMBOSACRAL (M54.5)  HYPERTENSION, ESSENTIAL (I10)     Review of Systems Jay Areola(Trine Fread M. Cerys Winget Castro; 06/10/2017 11:31 AM) General Present- Fatigue. Not Present- Appetite Loss, Chills, Fever, Night Sweats, Weight Gain and Weight Loss. Skin Not Present- Change in Wart/Mole, Dryness, Hives, Jaundice, New Lesions, Non-Healing Wounds, Rash and Ulcer. HEENT Not Present- Earache, Hearing Loss, Hoarseness, Nose Bleed, Oral Ulcers, Ringing in the Ears, Seasonal Allergies, Sinus Pain, Sore Throat, Visual Disturbances, Wears glasses/contact lenses and Yellow Eyes. Respiratory Present- Snoring. Not Present- Bloody sputum, Chronic Cough, Difficulty Breathing and Wheezing. Breast Not Present- Breast Mass, Breast Pain, Nipple Discharge and Skin Changes. Cardiovascular Not Present- Chest Pain, Difficulty Breathing Lying Down, Leg Cramps, Palpitations, Rapid Heart Rate, Shortness of Breath and Swelling of Extremities. Gastrointestinal Present- Excessive gas. Not Present- Abdominal Pain, Bloating, Bloody Stool, Change in Bowel Habits, Chronic diarrhea, Constipation, Difficulty Swallowing, Gets full quickly at meals, Hemorrhoids, Indigestion, Nausea, Rectal Pain and Vomiting. Male Genitourinary Not Present- Blood in Urine, Change in Urinary Stream, Frequency, Impotence, Nocturia, Painful Urination, Urgency and Urine Leakage. All other systems negative  Vitals Jay Castro(Angela Holmes; 06/10/2017 9:47 AM) 06/10/2017 9:47 AM Weight: 268.5 lb Height: 69.25in Body Surface Area: 2.35 m Body Mass Index: 39.36 kg/m  Temp.: 98.50F(Oral)   Pulse: 91 (Regular)  BP: 128/92 (Sitting, Left Arm, Standard)       Physical Exam Jay Areola(Dlisa Barnwell M. Taimur Fier Castro; 06/10/2017 11:31 AM) General Mental Status-Alert. General Appearance-Consistent with stated age. Hydration-Well hydrated. Voice-Normal. Note: morbidly obese evenly distributed   Head and Neck Head-normocephalic, atraumatic with no lesions or palpable masses. Trachea-midline. Thyroid Gland Characteristics - normal size and consistency.  Eye Eyeball - Bilateral-Extraocular movements intact. Sclera/Conjunctiva - Bilateral-No scleral icterus.  ENMT Ears -Note: normal external ears.  Mouth and Throat -Note: lips intact.   Chest and Lung Exam Chest and lung exam reveals -quiet, even and easy respiratory effort with no use of accessory muscles and on auscultation, normal breath sounds, no adventitious sounds and normal vocal resonance. Inspection Chest Wall - Normal. Back - normal.  Breast - Did not examine.  Cardiovascular Cardiovascular examination reveals -normal heart sounds, regular rate and rhythm with no murmurs and normal pedal pulses bilaterally.  Abdomen Inspection Inspection of the abdomen reveals - No Hernias. Skin - Scar - no surgical scars. Palpation/Percussion Palpation and Percussion of the abdomen reveal - Soft, Non Tender, No Rebound tenderness, No Rigidity (guarding) and No hepatosplenomegaly. Auscultation Auscultation of the abdomen reveals - Bowel sounds normal.  Peripheral Vascular Upper Extremity Palpation - Pulses bilaterally normal.  Neurologic Neurologic evaluation reveals -alert and oriented x 3 with no impairment of recent or remote memory. Mental Status-Normal.  Neuropsychiatric The patient's mood and affect are described as -normal. Judgment and Insight-insight is appropriate concerning matters relevant to self.  Musculoskeletal Normal Exam - Left-Upper Extremity Strength Normal and Lower  Extremity Strength Normal. Normal Exam - Right-Upper Extremity Strength Normal and Lower Extremity Strength Normal.  Lymphatic Head & Neck  General Head & Neck Lymphatics: Bilateral - Description - Normal. Axillary - Did not examine.  Femoral & Inguinal - Did not examine.    Assessment & Plan Jay Areola M. Javed Cotto Castro; 06/10/2017 11:32 AM) Ronny Bacon OBESITY (E66.01) Impression: At our initial visit the patient was undecided about sleeve gastrectomy or Roux-en-Y gastric bypass. He has decided to pursue Roux-en-Y gastric bypass. It appeals to him because of the longer track record and less incidence of reflux long-term compared to the sleeve gastrectomy. I believe the patient is still a candidate for laparoscopic Roux-en-Y gastric bypass. He has completed his required supervised weight loss. We rediscussed the typical operative steps as well as the typical recovery after gastric bypass surgery. He declined to speak about risk of surgery again. We had an extensive conversation at his initial visit regarding immediate as well as long-term risk after Roux-en-Y gastric bypass. We also discussed the importance of vitamin supplementation. All of his questions were asked and answered. He was given his postoperative prescriptions today. Current Plans Pt Education - EMW_preopbariatric Started OxyCODONE HCl 5 MG/5ML Oral Solution, 5-10 Milliliter every four hours, as needed, 75 Milliliter, 06/10/2017, No Refill. Started Pantoprazole Sodium 40 MG Oral Tablet Delayed Release, 1 (one) Tablet daily, #30, 06/10/2017, Ref. x2. Started Ondansetron 4 MG Oral Tablet Disintegrating, 1 (one) Tablet every six hours, as needed, #15, 06/10/2017, No Refill. FATTY LIVER (K76.0) OBSTRUCTIVE SLEEP APNEA ON CPAP (G47.33) HYPERTENSION, ESSENTIAL (I10) BACK PAIN, LUMBOSACRAL (M54.5)  Jay Sella. Andrey Campanile, Castro, FACS General, Bariatric, & Minimally Invasive Surgery [facility], [place]

## 2017-06-28 ENCOUNTER — Encounter (HOSPITAL_COMMUNITY)
Admission: RE | Admit: 2017-06-28 | Discharge: 2017-06-28 | Disposition: A | Discharge status: Home / Self Care | Payer: 59 | Source: Ambulatory Visit | Attending: General Surgery | Admitting: General Surgery

## 2017-06-28 ENCOUNTER — Encounter (HOSPITAL_COMMUNITY): Payer: Self-pay

## 2017-06-28 ENCOUNTER — Other Ambulatory Visit: Payer: Self-pay

## 2017-06-28 ENCOUNTER — Encounter (HOSPITAL_COMMUNITY): Payer: Self-pay | Admitting: Anesthesiology

## 2017-06-28 LAB — COMPREHENSIVE METABOLIC PANEL
ALBUMIN: 4.7 g/dL (ref 3.5–5.0)
ALT: 106 U/L — ABNORMAL HIGH (ref 17–63)
ANION GAP: 11 (ref 5–15)
AST: 65 U/L — AB (ref 15–41)
Alkaline Phosphatase: 57 U/L (ref 38–126)
BUN: 16 mg/dL (ref 6–20)
CALCIUM: 9.5 mg/dL (ref 8.9–10.3)
CO2: 25 mmol/L (ref 22–32)
Chloride: 101 mmol/L (ref 101–111)
Creatinine, Ser: 1.07 mg/dL (ref 0.61–1.24)
GFR calc Af Amer: >60 mL/min (ref >60)
GFR calc non Af Amer: >60 mL/min (ref >60)
GLUCOSE: 97 mg/dL (ref 65–99)
Potassium: 3.8 mmol/L (ref 3.5–5.1)
SODIUM: 137 mmol/L (ref 135–145)
Total Bilirubin: 3.9 mg/dL — ABNORMAL HIGH (ref 0.3–1.2)
Total Protein: 8.2 g/dL — ABNORMAL HIGH (ref 6.5–8.1)

## 2017-06-28 LAB — CBC WITH DIFFERENTIAL/PLATELET
BASOS ABS: 0 10*3/uL (ref 0.0–0.1)
BASOS PCT: 0 %
EOS ABS: 0.1 10*3/uL (ref 0.0–0.7)
Eosinophils Relative: 1 %
HCT: 48.5 % (ref 39.0–52.0)
Hemoglobin: 17.3 g/dL — ABNORMAL HIGH (ref 13.0–17.0)
Lymphocytes Relative: 34 %
Lymphs Abs: 2.3 10*3/uL (ref 0.7–4.0)
MCH: 29.4 pg (ref 26.0–34.0)
MCHC: 35.7 g/dL (ref 30.0–36.0)
MCV: 82.5 fL (ref 78.0–100.0)
MONOS PCT: 8 %
Monocytes Absolute: 0.6 10*3/uL (ref 0.1–1.0)
NEUTROS PCT: 57 %
Neutro Abs: 3.9 10*3/uL (ref 1.7–7.7)
Platelets: 220 10*3/uL (ref 150–400)
RBC: 5.88 MIL/uL — AB (ref 4.22–5.81)
RDW: 13.2 % (ref 11.5–15.5)
WBC: 6.9 10*3/uL (ref 4.0–10.5)

## 2017-06-28 LAB — ABO/RH: ABO/RH(D): B POS

## 2017-06-28 NOTE — Anesthesia Preprocedure Evaluation (Addendum)
Anesthesia Evaluation  Patient identified by MRN, date of birth, ID band Patient awake    Reviewed: Allergy & Precautions, NPO status , Patient's Chart, lab work & pertinent test results  Airway Mallampati: II  TM Distance: >3 FB Neck ROM: Full    Dental no notable dental hx. (+) Teeth Intact   Pulmonary sleep apnea and Continuous Positive Airway Pressure Ventilation , former smoker,    Pulmonary exam normal breath sounds clear to auscultation       Cardiovascular hypertension, Pt. on medications Normal cardiovascular exam Rhythm:Regular Rate:Normal     Neuro/Psych negative neurological ROS  negative psych ROS   GI/Hepatic negative GI ROS, Elevated LFT's   Endo/Other  Morbid obesity  Renal/GU negative Renal ROS  negative genitourinary   Musculoskeletal negative musculoskeletal ROS (+)   Abdominal (+) + obese,   Peds  Hematology negative hematology ROS (+)   Anesthesia Other Findings   Reproductive/Obstetrics                           Anesthesia Physical Anesthesia Plan  ASA: III  Anesthesia Plan: General   Post-op Pain Management:    Induction: Intravenous  PONV Risk Score and Plan: 4 or greater and Scopolamine patch - Pre-op, Ondansetron, Dexamethasone, Midazolam and Treatment may vary due to age or medical condition  Airway Management Planned: Oral ETT  Additional Equipment:   Intra-op Plan:   Post-operative Plan: Extubation in OR  Informed Consent: I have reviewed the patients History and Physical, chart, labs and discussed the procedure including the risks, benefits and alternatives for the proposed anesthesia with the patient or authorized representative who has indicated his/her understanding and acceptance.   Dental advisory given  Plan Discussed with: CRNA, Anesthesiologist and Surgeon  Anesthesia Plan Comments:        Anesthesia Quick Evaluation

## 2017-06-29 ENCOUNTER — Inpatient Hospital Stay (HOSPITAL_COMMUNITY)
Admission: RE | Admit: 2017-06-29 | Discharge: 2017-06-30 | DRG: 621 | Disposition: A | Discharge status: Home / Self Care | Payer: 59 | Source: Ambulatory Visit | Attending: General Surgery | Admitting: General Surgery

## 2017-06-29 ENCOUNTER — Inpatient Hospital Stay (HOSPITAL_COMMUNITY): Payer: 59 | Admitting: Anesthesiology

## 2017-06-29 ENCOUNTER — Encounter (HOSPITAL_COMMUNITY)
Admission: RE | Disposition: A | Discharge status: Home / Self Care | Payer: Self-pay | Source: Ambulatory Visit | Attending: General Surgery

## 2017-06-29 ENCOUNTER — Encounter (HOSPITAL_COMMUNITY): Payer: Self-pay

## 2017-06-29 DIAGNOSIS — I10 Essential (primary) hypertension: Secondary | ICD-10-CM | POA: Diagnosis present

## 2017-06-29 DIAGNOSIS — K219 Gastro-esophageal reflux disease without esophagitis: Secondary | ICD-10-CM | POA: Diagnosis present

## 2017-06-29 DIAGNOSIS — Z6841 Body Mass Index (BMI) 40.0 and over, adult: Secondary | ICD-10-CM

## 2017-06-29 DIAGNOSIS — F329 Major depressive disorder, single episode, unspecified: Secondary | ICD-10-CM | POA: Diagnosis present

## 2017-06-29 DIAGNOSIS — G4733 Obstructive sleep apnea (adult) (pediatric): Secondary | ICD-10-CM

## 2017-06-29 DIAGNOSIS — Z9989 Dependence on other enabling machines and devices: Secondary | ICD-10-CM

## 2017-06-29 DIAGNOSIS — Z87891 Personal history of nicotine dependence: Secondary | ICD-10-CM

## 2017-06-29 DIAGNOSIS — K760 Fatty (change of) liver, not elsewhere classified: Secondary | ICD-10-CM | POA: Diagnosis present

## 2017-06-29 DIAGNOSIS — F419 Anxiety disorder, unspecified: Secondary | ICD-10-CM | POA: Diagnosis present

## 2017-06-29 DIAGNOSIS — E7800 Pure hypercholesterolemia, unspecified: Secondary | ICD-10-CM | POA: Diagnosis present

## 2017-06-29 DIAGNOSIS — Z9884 Bariatric surgery status: Secondary | ICD-10-CM

## 2017-06-29 HISTORY — PX: GASTRIC ROUX-EN-Y: SHX5262

## 2017-06-29 LAB — HEMOGLOBIN AND HEMATOCRIT, BLOOD
HCT: 44 % (ref 39.0–52.0)
Hemoglobin: 15.1 g/dL (ref 13.0–17.0)

## 2017-06-29 LAB — TYPE AND SCREEN
ABO/RH(D): B POS
Antibody Screen: NEGATIVE

## 2017-06-29 SURGERY — LAPAROSCOPIC ROUX-EN-Y GASTRIC BYPASS WITH UPPER ENDOSCOPY
Anesthesia: General | Site: Abdomen

## 2017-06-29 MED ORDER — PHENYLEPHRINE 40 MCG/ML (10ML) SYRINGE FOR IV PUSH (FOR BLOOD PRESSURE SUPPORT)
PREFILLED_SYRINGE | INTRAVENOUS | Status: AC
  Filled 2017-06-29: qty 10

## 2017-06-29 MED ORDER — ACETAMINOPHEN 160 MG/5ML PO SOLN
650.0000 mg | Freq: Four times a day (QID) | ORAL | Status: DC
  Administered 2017-06-29 – 2017-06-30 (×4): 650 mg via ORAL
  Filled 2017-06-29 (×4): qty 20.3

## 2017-06-29 MED ORDER — METOCLOPRAMIDE HCL 5 MG/ML IJ SOLN: 10.0000 mg | Freq: Once | INTRAMUSCULAR | Status: DC | PRN

## 2017-06-29 MED ORDER — PROPOFOL 10 MG/ML IV BOLUS
INTRAVENOUS | Status: DC | PRN
  Administered 2017-06-29: 200 mg via INTRAVENOUS

## 2017-06-29 MED ORDER — SUGAMMADEX SODIUM 500 MG/5ML IV SOLN
INTRAVENOUS | Status: AC
  Filled 2017-06-29: qty 5

## 2017-06-29 MED ORDER — SODIUM CHLORIDE 0.9 % IJ SOLN
INTRAMUSCULAR | Status: DC | PRN
  Administered 2017-06-29: 50 mL

## 2017-06-29 MED ORDER — FAMOTIDINE IN NACL 20-0.9 MG/50ML-% IV SOLN
20.0000 mg | Freq: Two times a day (BID) | INTRAVENOUS | Status: DC
  Administered 2017-06-29 – 2017-06-30 (×3): 20 mg via INTRAVENOUS
  Filled 2017-06-29 (×3): qty 50

## 2017-06-29 MED ORDER — PHENYLEPHRINE 40 MCG/ML (10ML) SYRINGE FOR IV PUSH (FOR BLOOD PRESSURE SUPPORT)
PREFILLED_SYRINGE | INTRAVENOUS | Status: DC | PRN
  Administered 2017-06-29 (×2): 80 ug via INTRAVENOUS
  Administered 2017-06-29: 40 ug via INTRAVENOUS
  Administered 2017-06-29 (×2): 80 ug via INTRAVENOUS
  Administered 2017-06-29: 40 ug via INTRAVENOUS
  Administered 2017-06-29 (×5): 80 ug via INTRAVENOUS

## 2017-06-29 MED ORDER — SCOPOLAMINE 1 MG/3DAYS TD PT72
1.0000 | MEDICATED_PATCH | TRANSDERMAL | Status: DC
  Administered 2017-06-29: 1.5 mg via TRANSDERMAL

## 2017-06-29 MED ORDER — SUCCINYLCHOLINE CHLORIDE 200 MG/10ML IV SOSY
PREFILLED_SYRINGE | INTRAVENOUS | Status: DC | PRN
  Administered 2017-06-29: 140 mg via INTRAVENOUS

## 2017-06-29 MED ORDER — DEXAMETHASONE SODIUM PHOSPHATE 10 MG/ML IJ SOLN
INTRAMUSCULAR | Status: AC
  Filled 2017-06-29: qty 1

## 2017-06-29 MED ORDER — ONDANSETRON HCL 4 MG/2ML IJ SOLN
INTRAMUSCULAR | Status: AC
  Filled 2017-06-29: qty 2

## 2017-06-29 MED ORDER — SCOPOLAMINE 1 MG/3DAYS TD PT72
MEDICATED_PATCH | TRANSDERMAL | Status: AC
  Filled 2017-06-29: qty 1

## 2017-06-29 MED ORDER — METOPROLOL TARTRATE 5 MG/5ML IV SOLN
INTRAVENOUS | Status: AC
  Filled 2017-06-29: qty 5

## 2017-06-29 MED ORDER — GABAPENTIN 300 MG PO CAPS
ORAL_CAPSULE | ORAL | Status: AC
  Filled 2017-06-29: qty 1

## 2017-06-29 MED ORDER — FENTANYL CITRATE (PF) 250 MCG/5ML IJ SOLN
INTRAMUSCULAR | Status: AC
  Filled 2017-06-29: qty 5

## 2017-06-29 MED ORDER — BUPIVACAINE LIPOSOME 1.3 % IJ SUSP
20.0000 mL | Freq: Once | INTRAMUSCULAR | Status: AC
  Administered 2017-06-29: 20 mL
  Filled 2017-06-29: qty 20

## 2017-06-29 MED ORDER — GABAPENTIN 300 MG PO CAPS
300.0000 mg | ORAL_CAPSULE | ORAL | Status: AC
  Administered 2017-06-29: 300 mg via ORAL

## 2017-06-29 MED ORDER — MORPHINE SULFATE (PF) 2 MG/ML IV SOLN
1.0000 mg | INTRAVENOUS | Status: DC | PRN
  Administered 2017-06-29: 2 mg via INTRAVENOUS
  Filled 2017-06-29: qty 1

## 2017-06-29 MED ORDER — SODIUM CHLORIDE 0.9 % IJ SOLN
INTRAMUSCULAR | Status: AC
  Filled 2017-06-29: qty 50

## 2017-06-29 MED ORDER — ROCURONIUM BROMIDE 10 MG/ML (PF) SYRINGE
PREFILLED_SYRINGE | INTRAVENOUS | Status: AC
  Filled 2017-06-29: qty 5

## 2017-06-29 MED ORDER — EVICEL 5 ML EX KIT
PACK | Freq: Once | CUTANEOUS | Status: DC
  Filled 2017-06-29: qty 1

## 2017-06-29 MED ORDER — OXYCODONE HCL 5 MG/5ML PO SOLN: 5.0000 mg | ORAL | Status: DC | PRN

## 2017-06-29 MED ORDER — SIMETHICONE 80 MG PO CHEW
80.0000 mg | CHEWABLE_TABLET | Freq: Four times a day (QID) | ORAL | Status: DC | PRN
  Administered 2017-06-29: 80 mg via ORAL
  Filled 2017-06-29: qty 1

## 2017-06-29 MED ORDER — LIDOCAINE 2% (20 MG/ML) 5 ML SYRINGE
INTRAMUSCULAR | Status: AC
  Filled 2017-06-29: qty 5

## 2017-06-29 MED ORDER — PREMIER PROTEIN SHAKE
2.0000 [oz_av] | ORAL | Status: DC
  Administered 2017-06-30 (×4): 2 [oz_av] via ORAL

## 2017-06-29 MED ORDER — HYDROMORPHONE HCL 1 MG/ML IJ SOLN
INTRAMUSCULAR | Status: AC
  Filled 2017-06-29: qty 1

## 2017-06-29 MED ORDER — DIPHENHYDRAMINE HCL 50 MG/ML IJ SOLN: 12.5000 mg | Freq: Three times a day (TID) | INTRAMUSCULAR | Status: DC | PRN

## 2017-06-29 MED ORDER — ONDANSETRON HCL 4 MG/2ML IJ SOLN: 4.0000 mg | Freq: Four times a day (QID) | INTRAMUSCULAR | Status: DC | PRN

## 2017-06-29 MED ORDER — ENOXAPARIN SODIUM 30 MG/0.3ML ~~LOC~~ SOLN
30.0000 mg | Freq: Two times a day (BID) | SUBCUTANEOUS | Status: DC
  Administered 2017-06-29 – 2017-06-30 (×2): 30 mg via SUBCUTANEOUS
  Filled 2017-06-29 (×2): qty 0.3

## 2017-06-29 MED ORDER — CEFOTETAN DISODIUM-DEXTROSE 2-2.08 GM-%(50ML) IV SOLR
INTRAVENOUS | Status: AC
  Filled 2017-06-29: qty 50

## 2017-06-29 MED ORDER — GABAPENTIN 300 MG PO CAPS
300.0000 mg | ORAL_CAPSULE | Freq: Two times a day (BID) | ORAL | Status: DC
  Administered 2017-06-29 – 2017-06-30 (×2): 300 mg via ORAL
  Filled 2017-06-29 (×2): qty 1

## 2017-06-29 MED ORDER — BUPIVACAINE HCL (PF) 0.25 % IJ SOLN
INTRAMUSCULAR | Status: AC
  Filled 2017-06-29: qty 30

## 2017-06-29 MED ORDER — DEXAMETHASONE SODIUM PHOSPHATE 10 MG/ML IJ SOLN
INTRAMUSCULAR | Status: DC | PRN
  Administered 2017-06-29: 10 mg via INTRAVENOUS

## 2017-06-29 MED ORDER — ENALAPRILAT 1.25 MG/ML IV SOLN
1.2500 mg | Freq: Four times a day (QID) | INTRAVENOUS | Status: DC | PRN
  Filled 2017-06-29: qty 1

## 2017-06-29 MED ORDER — EVICEL 5 ML EX KIT
PACK | Freq: Once | CUTANEOUS | Status: AC
  Administered 2017-06-29: 5 mL
  Filled 2017-06-29: qty 1

## 2017-06-29 MED ORDER — MIDAZOLAM HCL 2 MG/2ML IJ SOLN
INTRAMUSCULAR | Status: AC
  Filled 2017-06-29: qty 2

## 2017-06-29 MED ORDER — APREPITANT 40 MG PO CAPS
ORAL_CAPSULE | ORAL | Status: AC
  Filled 2017-06-29: qty 1

## 2017-06-29 MED ORDER — SUGAMMADEX SODIUM 200 MG/2ML IV SOLN
INTRAVENOUS | Status: DC | PRN
  Administered 2017-06-29: 400 mg via INTRAVENOUS

## 2017-06-29 MED ORDER — ACETAMINOPHEN 500 MG PO TABS
1000.0000 mg | ORAL_TABLET | ORAL | Status: AC
  Administered 2017-06-29: 1000 mg via ORAL

## 2017-06-29 MED ORDER — ACETAMINOPHEN 500 MG PO TABS
ORAL_TABLET | ORAL | Status: AC
  Filled 2017-06-29: qty 2

## 2017-06-29 MED ORDER — PROMETHAZINE HCL 25 MG/ML IJ SOLN: 12.5000 mg | Freq: Four times a day (QID) | INTRAMUSCULAR | Status: DC | PRN

## 2017-06-29 MED ORDER — LACTATED RINGERS IV SOLN
INTRAVENOUS | Status: DC
  Administered 2017-06-29: 1000 mL via INTRAVENOUS
  Administered 2017-06-29: 08:00:00 via INTRAVENOUS

## 2017-06-29 MED ORDER — LIDOCAINE 2% (20 MG/ML) 5 ML SYRINGE
INTRAMUSCULAR | Status: DC | PRN
  Administered 2017-06-29: 100 mg via INTRAVENOUS

## 2017-06-29 MED ORDER — HEPARIN SODIUM (PORCINE) 5000 UNIT/ML IJ SOLN
5000.0000 [IU] | INTRAMUSCULAR | Status: AC
  Administered 2017-06-29: 5000 [IU] via SUBCUTANEOUS

## 2017-06-29 MED ORDER — SUCCINYLCHOLINE CHLORIDE 200 MG/10ML IV SOSY
PREFILLED_SYRINGE | INTRAVENOUS | Status: AC
  Filled 2017-06-29: qty 10

## 2017-06-29 MED ORDER — CHLORHEXIDINE GLUCONATE 4 % EX LIQD: 60.0000 mL | Freq: Once | CUTANEOUS | Status: DC

## 2017-06-29 MED ORDER — MIDAZOLAM HCL 5 MG/5ML IJ SOLN
INTRAMUSCULAR | Status: DC | PRN
  Administered 2017-06-29: 2 mg via INTRAVENOUS

## 2017-06-29 MED ORDER — DEXAMETHASONE SODIUM PHOSPHATE 4 MG/ML IJ SOLN: 4.0000 mg | INTRAMUSCULAR | Status: DC

## 2017-06-29 MED ORDER — 0.9 % SODIUM CHLORIDE (POUR BTL) OPTIME
TOPICAL | Status: DC | PRN
  Administered 2017-06-29: 1000 mL

## 2017-06-29 MED ORDER — HEPARIN SODIUM (PORCINE) 5000 UNIT/ML IJ SOLN
INTRAMUSCULAR | Status: AC
  Filled 2017-06-29: qty 1

## 2017-06-29 MED ORDER — ONDANSETRON HCL 4 MG/2ML IJ SOLN
INTRAMUSCULAR | Status: DC | PRN
  Administered 2017-06-29: 4 mg via INTRAVENOUS

## 2017-06-29 MED ORDER — ROCURONIUM BROMIDE 10 MG/ML (PF) SYRINGE
PREFILLED_SYRINGE | INTRAVENOUS | Status: DC | PRN
  Administered 2017-06-29: 10 mg via INTRAVENOUS
  Administered 2017-06-29: 5 mg via INTRAVENOUS
  Administered 2017-06-29: 20 mg via INTRAVENOUS
  Administered 2017-06-29: 65 mg via INTRAVENOUS
  Administered 2017-06-29: 20 mg via INTRAVENOUS

## 2017-06-29 MED ORDER — FENTANYL CITRATE (PF) 100 MCG/2ML IJ SOLN
INTRAMUSCULAR | Status: AC
  Filled 2017-06-29: qty 2

## 2017-06-29 MED ORDER — LACTATED RINGERS IR SOLN
Status: DC | PRN
  Administered 2017-06-29: 1000 mL

## 2017-06-29 MED ORDER — HYDROMORPHONE HCL 1 MG/ML IJ SOLN
0.2500 mg | INTRAMUSCULAR | Status: DC | PRN
  Administered 2017-06-29 (×2): 0.5 mg via INTRAVENOUS

## 2017-06-29 MED ORDER — CEFOTETAN DISODIUM-DEXTROSE 2-2.08 GM-%(50ML) IV SOLR
2.0000 g | INTRAVENOUS | Status: AC
  Administered 2017-06-29: 2 g via INTRAVENOUS

## 2017-06-29 MED ORDER — KCL IN DEXTROSE-NACL 20-5-0.45 MEQ/L-%-% IV SOLN
INTRAVENOUS | Status: DC
Start: 2017-06-29 — End: 2017-06-30
  Administered 2017-06-29 – 2017-06-30 (×2): via INTRAVENOUS
  Filled 2017-06-29 (×3): qty 1000

## 2017-06-29 MED ORDER — FENTANYL CITRATE (PF) 250 MCG/5ML IJ SOLN
INTRAMUSCULAR | Status: DC | PRN
  Administered 2017-06-29: 200 ug via INTRAVENOUS
  Administered 2017-06-29 (×3): 50 ug via INTRAVENOUS

## 2017-06-29 MED ORDER — PROPOFOL 10 MG/ML IV BOLUS
INTRAVENOUS | Status: AC
  Filled 2017-06-29: qty 40

## 2017-06-29 MED ORDER — APREPITANT 40 MG PO CAPS
40.0000 mg | ORAL_CAPSULE | ORAL | Status: AC
  Administered 2017-06-29: 40 mg via ORAL

## 2017-06-29 SURGICAL SUPPLY — 67 items
APPLIER CLIP ROT 13.4 12 LRG (CLIP)
BANDAGE ADH SHEER 1  50/CT (GAUZE/BANDAGES/DRESSINGS) ×3 IMPLANT
BENZOIN TINCTURE PRP APPL 2/3 (GAUZE/BANDAGES/DRESSINGS) ×3 IMPLANT
BLADE SURG SZ11 CARB STEEL (BLADE) ×3 IMPLANT
CABLE HIGH FREQUENCY MONO STRZ (ELECTRODE) ×3 IMPLANT
CHLORAPREP W/TINT 26ML (MISCELLANEOUS) ×6 IMPLANT
CLIP APPLIE ROT 13.4 12 LRG (CLIP) IMPLANT
CLIP SUT LAPRA TY ABSORB (SUTURE) ×6 IMPLANT
CLOSURE WOUND 1/2 X4 (GAUZE/BANDAGES/DRESSINGS) ×1
CUTTER FLEX LINEAR 45M (STAPLE) IMPLANT
DEVICE SUT QUICK LOAD TK 5 (STAPLE) IMPLANT
DEVICE SUT TI-KNOT TK 5X26 (MISCELLANEOUS) IMPLANT
DEVICE SUTURE ENDOST 10MM (ENDOMECHANICALS) ×3 IMPLANT
DEVICE TI KNOT TK5 (MISCELLANEOUS)
DRAIN PENROSE 18X1/4 LTX STRL (WOUND CARE) ×3 IMPLANT
ELECT REM PT RETURN 15FT ADLT (MISCELLANEOUS) ×3 IMPLANT
GAUZE SPONGE 4X4 12PLY STRL (GAUZE/BANDAGES/DRESSINGS) IMPLANT
GAUZE SPONGE 4X4 16PLY XRAY LF (GAUZE/BANDAGES/DRESSINGS) ×3 IMPLANT
GLOVE BIO SURGEON STRL SZ7.5 (GLOVE) IMPLANT
GLOVE INDICATOR 8.0 STRL GRN (GLOVE) ×3 IMPLANT
GOWN STRL REUS W/TWL XL LVL3 (GOWN DISPOSABLE) ×12 IMPLANT
HOVERMATT SINGLE USE (MISCELLANEOUS) ×3 IMPLANT
KIT BASIN OR (CUSTOM PROCEDURE TRAY) ×3 IMPLANT
KIT GASTRIC LAVAGE 34FR ADT (SET/KITS/TRAYS/PACK) ×3 IMPLANT
LUBRICANT JELLY K Y 4OZ (MISCELLANEOUS) ×3 IMPLANT
MARKER SKIN DUAL TIP RULER LAB (MISCELLANEOUS) ×3 IMPLANT
NEEDLE SPNL 22GX3.5 QUINCKE BK (NEEDLE) ×3 IMPLANT
PACK CARDIOVASCULAR III (CUSTOM PROCEDURE TRAY) ×3 IMPLANT
QUICK LOAD TK 5 (STAPLE)
RELOAD 45 VASCULAR/THIN (ENDOMECHANICALS) IMPLANT
RELOAD ENDO STITCH 2.0 (ENDOMECHANICALS) ×16
RELOAD STAPLE TA45 3.5 REG BLU (ENDOMECHANICALS) IMPLANT
RELOAD STAPLER BLUE 60MM (STAPLE) ×5 IMPLANT
RELOAD STAPLER GOLD 60MM (STAPLE) ×2 IMPLANT
RELOAD STAPLER WHITE 60MM (STAPLE) ×2 IMPLANT
SCISSORS LAP 5X45 EPIX DISP (ENDOMECHANICALS) ×3 IMPLANT
SET IRRIG TUBING LAPAROSCOPIC (IRRIGATION / IRRIGATOR) ×3 IMPLANT
SHEARS HARMONIC ACE PLUS 45CM (MISCELLANEOUS) ×3 IMPLANT
SLEEVE XCEL OPT CAN 5 100 (ENDOMECHANICALS) ×3 IMPLANT
SOLUTION ANTI FOG 6CC (MISCELLANEOUS) ×3 IMPLANT
STAPLER ECHELON BIOABSB 60 FLE (MISCELLANEOUS) IMPLANT
STAPLER ECHELON LONG 60 440 (INSTRUMENTS) ×3 IMPLANT
STAPLER RELOAD BLUE 60MM (STAPLE) ×15
STAPLER RELOAD GOLD 60MM (STAPLE) ×6
STAPLER RELOAD WHITE 60MM (STAPLE) ×6
STRIP CLOSURE SKIN 1/2X4 (GAUZE/BANDAGES/DRESSINGS) ×2 IMPLANT
SUT MNCRL AB 4-0 PS2 18 (SUTURE) ×3 IMPLANT
SUT RELOAD ENDO STITCH 2 48X1 (ENDOMECHANICALS) ×4
SUT RELOAD ENDO STITCH 2.0 (ENDOMECHANICALS) ×4
SUT SURGIDAC NAB ES-9 0 48 120 (SUTURE) IMPLANT
SUT VIC AB 2-0 SH 27 (SUTURE) ×2
SUT VIC AB 2-0 SH 27X BRD (SUTURE) ×1 IMPLANT
SUTURE RELOAD END STTCH 2 48X1 (ENDOMECHANICALS) ×4 IMPLANT
SUTURE RELOAD ENDO STITCH 2.0 (ENDOMECHANICALS) ×4 IMPLANT
SYR 10ML ECCENTRIC (SYRINGE) ×3 IMPLANT
SYR 20CC LL (SYRINGE) ×6 IMPLANT
TIP RIGID 35CM EVICEL (HEMOSTASIS) ×3 IMPLANT
TOWEL OR 17X26 10 PK STRL BLUE (TOWEL DISPOSABLE) ×3 IMPLANT
TOWEL OR NON WOVEN STRL DISP B (DISPOSABLE) ×3 IMPLANT
TRAY FOLEY W/METER SILVER 16FR (SET/KITS/TRAYS/PACK) ×3 IMPLANT
TROCAR BLADELESS OPT 5 100 (ENDOMECHANICALS) ×3 IMPLANT
TROCAR UNIVERSAL OPT 12M 100M (ENDOMECHANICALS) ×9 IMPLANT
TROCAR XCEL 12X100 BLDLESS (ENDOMECHANICALS) ×3 IMPLANT
TUBING CONNECTING 10 (TUBING) IMPLANT
TUBING CONNECTING 10' (TUBING)
TUBING ENDO SMARTCAP PENTAX (MISCELLANEOUS) ×3 IMPLANT
TUBING INSUF HEATED (TUBING) ×3 IMPLANT

## 2017-06-29 NOTE — Progress Notes (Signed)
Patient alert and oriented. Surgical day.   Provided support and encouragement.  Encouraged pulmonary toilet, ambulation and small sips of liquids; first 2 oz water given @ 1540.   All questions answered.  Will continue to monitor.

## 2017-06-29 NOTE — Anesthesia Procedure Notes (Signed)
Procedure Name: Intubation Date/Time: 06/29/2017 7:27 AM Performed by: Mitzie Na, CRNA Pre-anesthesia Checklist: Patient identified, Emergency Drugs available, Suction available, Patient being monitored and Timeout performed Patient Re-evaluated:Patient Re-evaluated prior to induction Oxygen Delivery Method: Circle system utilized Preoxygenation: Pre-oxygenation with 100% oxygen Induction Type: IV induction, Rapid sequence and Cricoid Pressure applied Laryngoscope Size: Mac and 4 Grade View: Grade I Tube type: Oral Tube size: 7.5 mm Number of attempts: 1 Airway Equipment and Method: Stylet Placement Confirmation: ETT inserted through vocal cords under direct vision,  positive ETCO2 and breath sounds checked- equal and bilateral Secured at: 22 cm Tube secured with: Tape Dental Injury: Teeth and Oropharynx as per pre-operative assessment

## 2017-06-29 NOTE — Transfer of Care (Signed)
Immediate Anesthesia Transfer of Care Note  Patient: Jay HivesJoseph G Castro  Procedure(s) Performed: LAPAROSCOPIC ROUX-EN-Y GASTRIC BYPASS WITH UPPER ENDOSCOPY (N/A Abdomen)  Patient Location: PACU  Anesthesia Type:General  Level of Consciousness: oriented, drowsy and patient cooperative  Airway & Oxygen Therapy: Patient Spontanous Breathing and Patient connected to face mask  Post-op Assessment: Report given to RN, Post -op Vital signs reviewed and stable and Patient moving all extremities  Post vital signs: Reviewed and stable  Last Vitals:  Vitals Value Taken Time  BP 124/73 06/29/2017 10:20 AM  Temp    Pulse 93 06/29/2017 10:22 AM  Resp 10 06/29/2017 10:22 AM  SpO2 100 % 06/29/2017 10:22 AM  Vitals shown include unvalidated device data.  Last Pain:  Vitals:   06/29/17 0556  PainSc: 0-No pain      Patients Stated Pain Goal: 4 (06/29/17 0556)  Complications: No apparent anesthesia complications

## 2017-06-29 NOTE — Op Note (Signed)
Mee HivesJoseph G Linderman [id number] Jul 27, 1990. 06/29/2017  Preoperative diagnosis:    Morbid obesity BMI 40   OSA on CPAP   Elevated LDL cholesterol level   Fatty liver   Essential hypertension  Postoperative  diagnosis:  1. same  Surgical procedure: Laparoscopic Roux-en-Y gastric bypass (ante-colic, ante-gastric); upper endoscopy  Surgeon: Atilano InaEric M Ipek Westra, M.D. FACS  Asst.: Feliciana RossettiLuke Kinsinger MD  Anesthesia: General plus exparel  Complications: None   EBL: Minimal   Drains: None   Disposition: PACU in good condition   Indications for procedure: 27yo male with morbid obesity who has been unsuccessful at sustained weight loss. The patient's comorbidities are listed above. We discussed the risk and benefits of surgery including but not limited to anesthesia risk, bleeding, infection, blood clot formation, anastomotic leak, anastomotic stricture, ulcer formation, death, respiratory complications, intestinal blockage, internal hernia, gallstone formation, vitamin and nutritional deficiencies, injury to surrounding structures, failure to lose weight and mood changes.   Description of procedure: Patient is brought to the operating room and general anesthesia induced. The patient had received preoperative broad-spectrum IV antibiotics and subcutaneous heparin. The abdomen was widely sterilely prepped with Chloraprep and draped. Patient timeout was performed and correct patient and procedure confirmed. Access was obtained with a 12 mm Optiview trocar in the left upper quadrant and pneumoperitoneum established without difficulty. Under direct vision 12 mm trocars were placed laterally in the right upper quadrant, right upper quadrant midclavicular line, and to the left and above the umbilicus for the camera port. A 5 mm trocar was placed laterally in the left upper quadrant. Exparel was infiltrated in bilateral lateral upper abdominal walls as a TAP block and subxiphoid location. The omentum was brought into  the upper abdomen and the transverse mesocolon elevated and the ligament of Treitz clearly identified. A 40 cm biliopancreatic limb was then carefully measured from the ligament of Treitz. The small intestine was divided at this point with a single firing of the white load linear stapler. A Penrose drain was sutured to the end of the Roux-en-Y limb for later identification. A 100 cm Roux-en-Y limb was then carefully measured. At this point a side-to-side anastomosis was created between the Roux limb and the end of the biliopancreatic limb. This was accomplished with a single firing of the 60 mm white load linear stapler. The common enterotomy was closed with a running 2-0 Vicryl begun at either end of the enterotomy and tied centrally. Tisseel tissue sealant was placed over the anastomosis. The mesenteric defect was then closed with running 2-0 silk. The omentum was then divided with the harmonic scalpel up towards the transverse colon to allow mobility of the Roux limb toward the gastric pouch. The patient was then placed in steep reversed Trendelenburg. Through a 5 mm subxiphoid site the [facility] was placed and the left lobe of the liver elevated with excellent exposure of the upper stomach and hiatus. The angle of Hiss was then mobilized with the harmonic scalpel. A 5 cm gastric pouch was then carefully measured along the lesser curve of the stomach. Dissection was carried along the lesser curve at this point with the Harmonic scalpel working carefully back toward the lesser sac at right angles to the lesser curve. The free lesser sac was then entered. After being sure all tubes were removed from the stomach an initial firing of the gold load 60 mm linear stapler was fired at right angles across the lesser curve for about 4 cm. The gastric pouch was further mobilized  posteriorly and then the pouch was completed with 5 further firings of the 60 mm blue load linear stapler up through the previously  dissected angle of His. It was ensured that the pouch was completely mobilized away from the gastric remnant. This created a nice tubular 5 cm gastric pouch. The Roux limb was then brought up in an antecolic fashion with the candycane facing to the patient's left without undue tension. The gastrojejunostomy was created with an initial posterior row of 2-0 Vicryl between the Roux limb and the staple line of the gastric pouch. Enterotomies were then made in the gastric pouch and the Roux limb with the harmonic scalpel and at approximately 2-2-1/2 cm anastomosis was created with a single firing of the 60mm blue load linear stapler. The staple line was inspected and was intact without bleeding. The common enterotomy was then closed with running 2-0 Vicryl begun at either end and tied centrally. The Ewall tube was then easily passed through the anastomosis and an outer anterior layer of running 2-0 Vicryl was placed. The Ewald tube was removed. With the outlet of the gastrojejunostomy clamped and under saline irrigation the assistant performed upper endoscopy and with the gastric pouch tensely distended with air-there was no evidence of leak on this test. The pouch was desufflated. The Vonita Moss defect was closed with running 2-0 silk. The abdomen was inspected for any evidence of bleeding or bowel injury and everything looked fine. The Nathanson retractor was removed under direct vision after coating the anastomosis with Tisseel tissue sealant. All CO2 was evacuated and trochars removed. Skin incisions were closed with 4-0 monocryl in a subcuticular fashion followed by benzoin,steri-strips and bandages. Sponge needle and instrument counts were correct. The patient was taken to the PACU in good condition.    Mary Sella. Andrey Campanile, MD, FACS General, Bariatric, & Minimally Invasive Surgery [facility], [place]

## 2017-06-29 NOTE — Progress Notes (Signed)
Pt. seen for CPAP order, home CPAP, made aware to notify if needed.

## 2017-06-29 NOTE — Op Note (Signed)
Preoperative diagnosis: Roux-en-Y gastric bypass  Postoperative diagnosis: Same   Procedure: Upper endoscopy   Surgeon: Feliciana RossettiLuke Joleigh Mineau, M.D.  Anesthesia: Gen.   Indications for procedure: This patient was undergoing a Roux-en-Y gastric bypass.   Description of procedure: The endoscopy was placed in the mouth and into the oropharynx and under endoscopic vision it was advanced to the esophagogastric junction. The pouch was insufflated and no bleeding or bubbles were seen. The GEJ was identified at 43cm from the teeth. The anastomosis was visualized and patent at 50cm. No bleeding or leaks were detected. The scope was withdrawn without difficulty.   Feliciana RossettiLuke Kyler Germer, M.D. General, Bariatric, & Minimally Invasive Surgery [facility] [place]

## 2017-06-29 NOTE — Anesthesia Postprocedure Evaluation (Signed)
Anesthesia Post Note  Patient: Jay Castro  Procedure(s) Performed: LAPAROSCOPIC ROUX-EN-Y GASTRIC BYPASS WITH UPPER ENDOSCOPY (N/A Abdomen)     Patient location during evaluation: PACU Anesthesia Type: General Level of consciousness: awake and alert Pain management: pain level controlled Vital Signs Assessment: post-procedure vital signs reviewed and stable Respiratory status: spontaneous breathing, nonlabored ventilation, respiratory function stable and patient connected to nasal cannula oxygen Cardiovascular status: blood pressure returned to baseline and stable Postop Assessment: no apparent nausea or vomiting Anesthetic complications: no    Last Vitals:  Vitals:   06/29/17 1045 06/29/17 1100  BP: 134/86 127/84  Pulse: 86 88  Resp: 11 14  Temp:    SpO2: 94% 100%    Last Pain:  Vitals:   06/29/17 1100  PainSc: Asleep                 Linzy Laury A.

## 2017-06-29 NOTE — Discharge Instructions (Signed)
° ° ° °GASTRIC BYPASS/SLEEVE ° Home Care Instructions ° ° These instructions are to help you care for yourself when you go home. ° °Call: If you have any problems. °• Call [phone] and ask for the surgeon on call °• If you need immediate help, come to the ER at .  °• Tell the ER staff that you are a new post-op gastric bypass or gastric sleeve patient °  °Signs and symptoms to report: • Severe vomiting or nausea °o If you cannot keep down clear liquids for longer than 1 day, call your surgeon  °• Abdominal pain that does not get better after taking your pain medication °• Fever over 100.4° F with chills °• Heart beating over 100 beats a minute °• Shortness of breath at rest °• Chest pain °•  Redness, swelling, drainage, or foul odor at incision (surgical) sites °•  If your incisions open or pull apart °• Swelling or pain in calf (lower leg) °• Diarrhea (Loose bowel movements that happen often), frequent watery, uncontrolled bowel movements °• Constipation, (no bowel movements for 3 days) if this happens: Pick one °o Milk of Magnesia, 2 tablespoons by mouth, 3 times a day for 2 days if needed °o Stop taking Milk of Magnesia once you have a bowel movement °o Call your doctor if constipation continues °Or °o Miralax  (instead of Milk of Magnesia) following the label instructions °o Stop taking Miralax once you have a bowel movement °o Call your doctor if constipation continues °• Anything you think is not normal °  °Normal side effects after surgery: • Unable to sleep at night or unable to focus °• Irritability or moody °• Being tearful (crying) or depressed °These are common complaints, possibly related to your anesthesia medications that put you to sleep, stress of surgery, and change in lifestyle.  This usually goes away a few weeks after surgery.  If these feelings continue, call your primary care doctor. °  °Wound Care: You may have surgical glue, steri-strips, or staples over your incisions after  surgery °• Surgical glue:  Looks like a clear film over your incisions and will wear off a little at a time °• Steri-strips: Strips of tape over your incisions. You may notice a yellowish color on the skin under the steri-strips. This is used to make the   steri-strips stick better. Do not pull the steri-strips off - let them fall off °• Staples: Staples may be removed before you leave the hospital °o If you go home with staples, call [facility], [phone] at for an appointment with your surgeon’s nurse to have staples removed 10 days after surgery. °• Showering: You may shower two (2) days after your surgery unless your surgeon tells you differently °o Wash gently around incisions with warm soapy water, rinse well, and gently pat dry  °o No tub baths until staples are removed, steri-strips fall off or glue is gone.  °  °Medications: • Medications should be liquid or crushed if larger than the size of a dime °• Extended release pills (medication that release a little bit at a time through the day) should NOT be crushed or cut. (examples include XL, ER, DR, SR) °• Depending on the size and number of medications you take, you may need to space (take a few throughout the day)/change the time you take your medications so that you do not over-fill your pouch (smaller stomach) °• Make sure you follow-up with your primary care doctor to   make medication changes needed during rapid weight loss and life-style changes °• If you have diabetes, follow up with the doctor that orders your diabetes medication(s) within one week after surgery and check your blood sugar regularly. °• Do not drive while taking prescription pain medication  °• It is ok to take Tylenol by the bottle instructions with your pain medicine or instead of your pain medicine as needed.  DO NOT TAKE NSAIDS (EXAMPLES OF NSAIDS:  IBUPROFREN/ NAPROXEN)  °Diet:                    First 2 Weeks ° You will see the dietician t about two (2) weeks  after your surgery. The dietician will increase the types of foods you can eat if you are handling liquids well: °• If you have severe vomiting or nausea and cannot keep down clear liquids lasting longer than 1 day, call your surgeon @ ([phone]) °Protein Shake °• Drink at least 2 ounces of shake 5-6 times per day °• Each serving of protein shakes (usually 8 - 12 ounces) should have: °o 15 grams of protein  °o And no more than 5 grams of carbohydrate  °• Goal for protein each day: °o Men = 80 grams per day °o Women = 60 grams per day °• Protein powder may be added to fluids such as non-fat milk or Lactaid milk or unsweetened Soy/Almond milk (limit to 35 grams added protein powder per serving) ° °Hydration °• Slowly increase the amount of water and other clear liquids as tolerated (See Acceptable Fluids) °• Slowly increase the amount of protein shake as tolerated  °•  Sip fluids slowly and throughout the day.  Do not use straws. °• May use sugar substitutes in small amounts (no more than 6 - 8 packets per day; i.e. Splenda) ° °Fluid Goal °• The first goal is to drink at least 8 ounces of protein shake/drink per day (or as directed by the nutritionist); some examples of protein shakes are Syntrax Nectar, Adkins Advantage, EAS Edge HP, and Unjury. See handout from pre-op Bariatric Education Class: °o Slowly increase the amount of protein shake you drink as tolerated °o You may find it easier to slowly sip shakes throughout the day °o It is important to get your proteins in first °• Your fluid goal is to drink 64 - 100 ounces of fluid daily °o It may take a few weeks to build up to this °• 32 oz (or more) should be clear liquids  °And  °• 32 oz (or more) should be full liquids (see below for examples) °• Liquids should not contain sugar, caffeine, or carbonation ° °Clear Liquids: °• Water or Sugar-free flavored water (i.e. Fruit H2O, Propel) °• Decaffeinated coffee or tea (sugar-free) °• Crystal Lite, Wyler’s Lite,  Minute Maid Lite °• Sugar-free Jell-O °• Bouillon or broth °• Sugar-free Popsicle:   *Less than 20 calories each; Limit 1 per day ° °Full Liquids: °Protein Shakes/Drinks + 2 choices per day of other full liquids °• Full liquids must be: °o No More Than 15 grams of Carbs per serving  °o No More Than 3 grams of Fat per serving °• Strained low-fat cream soup (except Cream of Potato or Tomato) °• Non-Fat milk °• Fat-free Lactaid Milk °• Unsweetened Soy Or Unsweetened Almond Milk °• Low Sugar yogurt (Dannon Lite & Fit, Greek yogurt; Oikos Triple Zero; Chobani Simply 100; Yoplait 100 calorie Greek - No Fruit on the Bottom) ° °  °Vitamins   and Minerals • Start 1 day after surgery unless otherwise directed by your surgeon °• 2 Chewable Bariatric Specific Multivitamin / Multimineral Supplement with iron (Example: Bariatric Advantage Multi EA) °• Chewable Calcium with Vitamin D-3 °(Example: 3 Chewable Calcium Plus 600 with Vitamin D-3) °o Take 500 mg three (3) times a day for a total of 1500 mg each day °o Do not take all 3 doses of calcium at one time as it may cause constipation, and you can only absorb 500 mg  at a time  °o Do not mix multivitamins containing iron with calcium supplements; take 2 hours apart °• Menstruating women and those with a history of anemia (a blood disease that causes weakness) may need extra iron °o Talk with your doctor to see if you need more iron °• Do not stop taking or change any vitamins or minerals until you talk to your dietitian or surgeon °• Your Dietitian and/or surgeon must approve all vitamin and mineral supplements °  °Activity and Exercise: Limit your physical activity as instructed by your doctor.  It is important to continue walking at home.  During this time, use these guidelines: °• Do not lift anything greater than ten (10) pounds for at least two (2) weeks °• Do not go back to work or drive until your surgeon says you can °• You may have sex when you feel comfortable  °o It is  VERY important for male patients to use a reliable birth control method; fertility often increases after surgery  °o All hormonal birth control will be ineffective for 30 days after surgery due to medications given during surgery a barrier method must be used. °o Do not get pregnant for at least 18 months °• Start exercising as soon as your doctor tells you that you can °o Make sure your doctor approves any physical activity °• Start with a simple walking program °• Walk 5-15 minutes each day, 7 days per week.  °• Slowly increase until you are walking 30-45 minutes per day °Consider joining our BELT program. [phone] or email [email or web address] °  °Special Instructions Things to remember: °• Use your CPAP when sleeping if this applies to you ° °• [facility] has two free Bariatric Surgery Support Groups that meet monthly °o The 3rd Thursday of each month, 6 pm, [facility]  °o The 2nd Friday of each month, 11:45 am in the private dining room in the basement of  °• It is very important to keep all follow up appointments with your surgeon, dietitian, primary care physician, and behavioral health practitioner °• Routine follow up schedule with your surgeon include appointments at 2-3 weeks, 6-8 weeks, 6 months, and 1 year at a minimum.  Your surgeon may request to see you more often.   °o After the first year, please follow up with your bariatric surgeon and dietitian at least once a year in order to maintain best weight loss results °[facility]: [phone] °[facility]: [phone] °Bariatric Nurse Coordinator: [phone] °  °   Reviewed and Endorsed  °by [facility] Patient Education Committee, June, 2016 °Edits Approved: Aug, 2018 ° ° ° °

## 2017-06-29 NOTE — Interval H&P Note (Signed)
History and Physical Interval Note:  06/29/2017 7:14 AM  Jay HivesJoseph G Mayeda  has presented today for surgery, with the diagnosis of MORBID OBESITY  The various methods of treatment have been discussed with the patient and family. After consideration of risks, benefits and other options for treatment, the patient has consented to  Procedure(s): LAPAROSCOPIC ROUX-EN-Y GASTRIC BYPASS WITH UPPER ENDOSCOPY (N/A) as a surgical intervention .  The patient's history has been reviewed, patient examined, no change in status, stable for surgery.  I have reviewed the patient's chart and labs.  Questions were answered to the patient's satisfaction.    Mary SellaEric M. Andrey CampanileWilson, MD, FACS General, Bariatric, & Minimally Invasive Surgery [facility] [place]   Gaynelle AduEric Carlisia Geno

## 2017-06-30 ENCOUNTER — Encounter (HOSPITAL_COMMUNITY): Payer: Self-pay | Admitting: General Surgery

## 2017-06-30 LAB — COMPREHENSIVE METABOLIC PANEL
ALT: 162 U/L — AB (ref 17–63)
AST: 103 U/L — ABNORMAL HIGH (ref 15–41)
Albumin: 3.9 g/dL (ref 3.5–5.0)
Alkaline Phosphatase: 44 U/L (ref 38–126)
Anion gap: 10 (ref 5–15)
BUN: 12 mg/dL (ref 6–20)
CHLORIDE: 105 mmol/L (ref 101–111)
CO2: 25 mmol/L (ref 22–32)
CREATININE: 1.01 mg/dL (ref 0.61–1.24)
Calcium: 8.8 mg/dL — ABNORMAL LOW (ref 8.9–10.3)
GFR calc non Af Amer: >60 mL/min (ref >60)
Glucose, Bld: 163 mg/dL — ABNORMAL HIGH (ref 65–99)
Potassium: 4.3 mmol/L (ref 3.5–5.1)
Sodium: 140 mmol/L (ref 135–145)
Total Bilirubin: 2.3 mg/dL — ABNORMAL HIGH (ref 0.3–1.2)
Total Protein: 7 g/dL (ref 6.5–8.1)

## 2017-06-30 LAB — CBC WITH DIFFERENTIAL/PLATELET
BASOS ABS: 0 10*3/uL (ref 0.0–0.1)
Basophils Relative: 0 %
EOS ABS: 0 10*3/uL (ref 0.0–0.7)
EOS PCT: 0 %
HCT: 42.5 % (ref 39.0–52.0)
Hemoglobin: 14.7 g/dL (ref 13.0–17.0)
LYMPHS ABS: 1.1 10*3/uL (ref 0.7–4.0)
Lymphocytes Relative: 9 %
MCH: 28.9 pg (ref 26.0–34.0)
MCHC: 34.6 g/dL (ref 30.0–36.0)
MCV: 83.5 fL (ref 78.0–100.0)
Monocytes Absolute: 1.4 10*3/uL — ABNORMAL HIGH (ref 0.1–1.0)
Monocytes Relative: 12 %
Neutro Abs: 9.2 10*3/uL — ABNORMAL HIGH (ref 1.7–7.7)
Neutrophils Relative %: 79 %
PLATELETS: 198 10*3/uL (ref 150–400)
RBC: 5.09 MIL/uL (ref 4.22–5.81)
RDW: 13.2 % (ref 11.5–15.5)
WBC: 11.7 10*3/uL — AB (ref 4.0–10.5)

## 2017-06-30 MED ORDER — OXYCODONE HCL 5 MG/5ML PO SOLN: 5.0000 mg | Freq: Four times a day (QID) | ORAL | 0 refills | Status: AC | PRN

## 2017-06-30 NOTE — Progress Notes (Signed)
Nutrition Education Note  Received consult for diet education per DROP protocol.   Spoke with pt at bedside. Pt reports no N/V at this time. Pt reports a small abdominal cramp when taking medications this morning but it has subsided. Pt reports he has taken 14 laps around the patient floor since surgery. Pt is on his second Premier Protein shake and is tolerating well. Pt seems prepared, he reports he has Premier shakes, Opurity multivitamins, and Calcitrate calcium supplements at home. Pt states his wife recently had the surgery so he knows what to expect and that he has a good support at home. Pt reports he has follow-up at [facility] scheduled for May 7th, 2019.   Discussed 2 week post op diet with pt. Emphasized that liquids must be non carbonated, non caffeinated, and sugar free. Fluid goals discussed. Pt to follow up with outpatient bariatric RD for further diet progression after 2 weeks. Multivitamins and minerals also reviewed. Teach back method used, pt expressed understanding, expect good compliance.  Diet: First 2 Weeks  You will see the nutritionist about two (2) weeks after your surgery. The nutritionist will increase the types of foods you can eat if you are handling liquids well:  If you have severe vomiting or nausea and cannot handle clear liquids lasting longer than 1 day, call your surgeon  Protein Shake  Drink at least 2 ounces of shake 5-6 times per day  Each serving of protein shakes (usually 8 - 12 ounces) should have a minimum of:  15 grams of protein  And no more than 5 grams of carbohydrate  Goal for protein each day:  Men = 80 grams per day  Women = 60 grams per day  Protein powder may be added to fluids such as non-fat milk or Lactaid milk or Soy milk (limit to 35 grams added protein powder per serving)   Hydration  Slowly increase the amount of water and other clear liquids as tolerated (See Acceptable Fluids)  Slowly increase the amount of protein shake as tolerated   Sip fluids slowly and throughout the day  May use sugar substitutes in small amounts (no more than 6 - 8 packets per day; i.e. Splenda)   Fluid Goal  The first goal is to drink at least 8 ounces of protein shake/drink per day (or as directed by the nutritionist); some examples of protein shakes are Premier Protein, [employer], Dillard's, EAS Edge HP, and Unjury. See handout from pre-op Bariatric Education Class:  Slowly increase the amount of protein shake you drink as tolerated  You may find it easier to slowly sip shakes throughout the day  It is important to get your proteins in first  Your fluid goal is to drink 64 - 100 ounces of fluid daily  It may take a few weeks to build up to this  32 oz (or more) should be clear liquids  And  32 oz (or more) should be full liquids (see below for examples)  Liquids should not contain sugar, caffeine, or carbonation   Clear Liquids:  Water or Sugar-free flavored water (i.e. Fruit H2O, Propel)  Decaffeinated coffee or tea (sugar-free)  Crystal Lite, Wyler's Lite, Minute Maid Lite  Sugar-free Jell-O  Bouillon or broth  Sugar-free Popsicle: *Less than 20 calories each; Limit 1 per day   Full Liquids:  Protein Shakes/Drinks + 2 choices per day of other full liquids  Full liquids must be:  No More Than 12 grams of Carbs per serving  No More Than 3 grams of Fat per serving  Strained low-fat cream soup  Non-Fat milk  Fat-free Lactaid Milk  Sugar-free yogurt (Dannon Lite & Fit, [place] yogurt, Oikos Zero)   BakersfieldAllison Chaneka Trefz, [place], [place] IslandRDN, LDN 06/30/2017 10:50 AM

## 2017-06-30 NOTE — Progress Notes (Signed)
Pt given discharge instructions all pt questions were answered.

## 2017-06-30 NOTE — Progress Notes (Addendum)
Patient alert and oriented, pain is controlled. Patient is tolerating fluids, advanced to protein shake today, patient is tolerating well. Ambulating frequently and using IS.   Reviewed Gastric Bypass discharge instructions with patient and patient is able to articulate understanding. Provided information on BELT program, Support Group and [facility] outpatient pharmacy. All questions answered, will continue to monitor.

## 2017-06-30 NOTE — Progress Notes (Signed)
Patient ID: Jay Castro, male   DOB: 04-22-1990, 27 y.o.   MRN: [id number]   Progress Note: Metabolic and Bariatric Surgery Service   Chief Complaint/Subjective: Doing well. No n/v. Ambulated multiple times. Minimal pain. A little cramp with po.  Did well with water - already doing shake  Objective: Vital signs in last 24 hours: Temp:  [97.6 F (36.4 C)-98.6 F (37 C)] 97.6 F (36.4 C) (04/24 0538) Pulse Rate:  [69-105] 89 (04/24 0538) Resp:  [9-27] 18 (04/24 0538) BP: (124-155)/(70-97) 145/80 (04/24 0538) SpO2:  [94 %-100 %] 100 % (04/24 0538) Weight:  [123.8 kg (272 lb 14.9 oz)] 123.8 kg (272 lb 14.9 oz) (04/24 0538) Last BM Date: 06/28/17  Intake/Output from previous day: 04/23 0701 - 04/24 0700 In: 3862.5 [P.O.:600; I.V.:3162.5; IV Piggyback:100] Out: 3950 [Urine:3900; Blood:50] Intake/Output this shift: No intake/output data recorded.  Lungs: cta, symm  Cardiovascular: reg  Abd: soft, obese, mild approp TTP, incisions ok  Extremities: no edema, +SCDs  Neuro: approp, nonfocal  Lab Results: CBC  Recent Labs    06/28/17 1453 06/29/17 1040 06/30/17 0446  WBC 6.9  --  11.7*  HGB 17.3* 15.1 14.7  HCT 48.5 44.0 42.5  PLT 220  --  198   BMET Recent Labs    06/28/17 1453 06/30/17 0446  NA 137 140  K 3.8 4.3  CL 101 105  CO2 25 25  GLUCOSE 97 163*  BUN 16 12  CREATININE 1.07 1.01  CALCIUM 9.5 8.8*   PT/INR No results for input(s): LABPROT, INR in the last 72 hours. ABG No results for input(s): PHART, HCO3 in the last 72 hours.  Invalid input(s): PCO2, PO2  Studies/Results:  Anti-infectives: Anti-infectives (From admission, onward)   Start     Dose/Rate Route Frequency Ordered Stop   06/29/17 0540  cefoTEtan in Dextrose 5% (CEFOTAN) 2-2.08 GM-%(50ML) IVPB    Note to Pharmacy:  Curlene DolphinWhitlow, Cheryl   : cabinet override      06/29/17 0540 06/29/17 0741   06/29/17 0538  cefoTEtan in Dextrose 5% (CEFOTAN) IVPB 2 g     2 g Intravenous On call to O.R.  06/29/17 [id number] 06/29/17 0741      Medications: Scheduled Meds: . acetaminophen (TYLENOL) oral liquid 160 mg/5 mL  650 mg Oral Q6H  . enoxaparin (LOVENOX) injection  30 mg Subcutaneous Q12H  . gabapentin  300 mg Oral BID  . protein supplement shake  2 oz Oral Q2H   Continuous Infusions: . dextrose 5 % and 0.45 % NaCl with KCl 20 mEq/L 125 mL/hr at 06/30/17 0726  . famotidine (PEPCID) IV Stopped (06/29/17 2154)   PRN Meds:.diphenhydrAMINE, enalaprilat, morphine injection, ondansetron (ZOFRAN) IV, oxyCODONE, promethazine, simethicone  Assessment/Plan: Patient Active Problem List   Diagnosis Date Noted  . Morbid obesity (HCC) 06/29/2017  . OSA on CPAP 06/29/2017  . Elevated LDL cholesterol level 06/29/2017  . Fatty liver 06/29/2017  . Essential hypertension 06/29/2017   s/p Procedure(s): LAPAROSCOPIC ROUX-EN-Y GASTRIC BYPASS WITH UPPER ENDOSCOPY 06/29/2017  Doing well. Looks great No fever. No tachy. No wbc Cont post op diet Start dc teaching Discussed teaching  Disposition:  LOS: 1 day  The patient will be in the hospital for normal postop protocol. Believe he can be dc later today as long as cont to do well  Gaynelle AduEric Jadan Rouillard, MD [phone] [facility]

## 2017-07-05 ENCOUNTER — Telehealth (HOSPITAL_COMMUNITY): Payer: Self-pay

## 2017-07-05 NOTE — Discharge Summary (Signed)
Physician Discharge Summary  CODEN FRANCHI WUJ:[id number] DOB: 05-Mar-1991 DOA: 06/29/2017  PCP: Rogers Seeds, MD  Admit date: 06/29/2017 Discharge date: 06/30/2017  Recommendations for Outpatient Follow-up:    Follow-up Information    Gaynelle Adu, MD. Go on 07/22/2017.   Specialty:  General Surgery Why:  10:15 AM; please arrive at 10 AM Contact information: [address] [place] [address] [phone]          Discharge Diagnoses:  Principal Problem:   Morbid obesity (HCC) Active Problems:   OSA on CPAP   Elevated LDL cholesterol level   Fatty liver   Essential hypertension   Surgical Procedure: Laparoscopic Roux-en-Y gastric bypass, upper endoscopy  Discharge Condition: Good Disposition: Home  Diet recommendation: Postoperative gastric bypass diet  Filed Weights   06/29/17 0556 06/30/17 0538  Weight: 123.8 kg (273 lb) 123.8 kg (272 lb 14.9 oz)     Hospital Course:  The patient was admitted for a planned laparoscopic Roux-en-Y gastric bypass. Please see operative note. Preoperatively the patient was given 5000 units of subcutaneous heparin for DVT prophylaxis. ERAS protocol was used. Postoperative prophylactic Lovenox dosing was started on the evening of postoperative day 0.  The patient was started on ice chips and water on the evening of POD 0 which they tolerated. On postoperative day 1 The patient's diet was advanced to protein shakes which they also tolerated. On POD 2, The patient was ambulating without difficulty. Their vital signs are stable without fever or tachycardia. Their hemoglobin had remained stable. The patient was maintained on their home settings for CPAP therapy. The patient had received discharge instructions and counseling. They were deemed stable for discharge.  BP 128/88 (BP Location: Left Arm)   Pulse 98   Temp 98 F (36.7 C) (Oral)   Resp 18   Ht  (1.753 m)   Wt 123.8 kg (272 lb 14.9 oz)   SpO2 100%   BMI 40.30 kg/m    Gen: alert, NAD, non-toxic appearing Pupils: equal, no scleral icterus Pulm: Lungs clear to auscultation, symmetric chest rise CV: regular rate and rhythm Abd: soft, min tender, nondistended. No cellulitis. No incisional hernia Ext: no edema, no calf tenderness Skin: no rash, no jaundice  Discharge Instructions  Discharge Instructions    Ambulate hourly while awake   Complete by:  As directed    Call MD for:  difficulty breathing, headache or visual disturbances   Complete by:  As directed    Call MD for:  persistant dizziness or light-headedness   Complete by:  As directed    Call MD for:  persistant nausea and vomiting   Complete by:  As directed    Call MD for:  redness, tenderness, or signs of infection (pain, swelling, redness, odor or green/yellow discharge around incision site)   Complete by:  As directed    Call MD for:  severe uncontrolled pain   Complete by:  As directed    Call MD for:  temperature >101 F   Complete by:  As directed    Diet bariatric full liquid   Complete by:  As directed    Discharge instructions   Complete by:  As directed    See bariatric discharge instructions   Incentive spirometry   Complete by:  As directed    Perform hourly while awake     Allergies as of 06/30/2017   No Known Allergies     Medication List    STOP taking these  medications   doxycycline 100 MG tablet Commonly known as:  VIBRA-TABS   ibuprofen 200 MG tablet Commonly known as:  ADVIL,MOTRIN     TAKE these medications   acetaminophen 325 MG tablet Commonly known as:  TYLENOL Take 650 mg by mouth daily as needed for moderate pain or headache.   lisinopril 10 MG tablet Commonly known as:  PRINIVIL,ZESTRIL Take 10 mg by mouth daily. Notes to patient:  Monitor Blood Pressure Daily and keep a log for primary care physician.  You may need to make changes to your medications with rapid weight loss.     oxyCODONE 5 MG/5ML solution Commonly known as:   ROXICODONE Take 5 mLs (5 mg total) by mouth every 6 (six) hours as needed for severe pain.      Follow-up Information    Gaynelle Adu, MD. Go on 07/22/2017.   Specialty:  General Surgery Why:  10:15 AM; please arrive at 10 AM Contact information: [address] [place] [address] [phone]            The results of significant diagnostics from this hospitalization (including imaging, microbiology, ancillary and laboratory) are listed below for reference.    Significant Diagnostic Studies: No results found.  Labs: Basic Metabolic Panel: Recent Labs  Lab 06/28/17 1453 06/30/17 0446  NA 137 140  K 3.8 4.3  CL 101 105  CO2 25 25  GLUCOSE 97 163*  BUN 16 12  CREATININE 1.07 1.01  CALCIUM 9.5 8.8*   Liver Function Tests: Recent Labs  Lab 06/28/17 1453 06/30/17 0446  AST 65* 103*  ALT 106* 162*  ALKPHOS 57 44  BILITOT 3.9* 2.3*  PROT 8.2* 7.0  ALBUMIN 4.7 3.9    CBC: Recent Labs  Lab 06/28/17 1453 06/29/17 1040 06/30/17 0446  WBC 6.9  --  11.7*  NEUTROABS 3.9  --  9.2*  HGB 17.3* 15.1 14.7  HCT 48.5 44.0 42.5  MCV 82.5  --  83.5  PLT 220  --  198    CBG: No results for input(s): GLUCAP in the last 168 hours.  Principal Problem:   Morbid obesity (HCC) Active Problems:   OSA on CPAP   Elevated LDL cholesterol level   Fatty liver   Essential hypertension   Time coordinating discharge: 10 min  Signed:  Atilano Ina, MD [facility], [place] [phone] 07/05/2017, 10:20 AM

## 2017-07-05 NOTE — Telephone Encounter (Signed)
Patient called to discuss post bariatric surgery follow up questions.  See below:   1.  Tell me about your pain and pain management?no pain  2.  Let's talk about fluid intake.  How much total fluid are you taking in?60 ounces of fluid   3.  How much protein have you taken in the last 2 days?anywhere from 30-60 grams of protien, discussed goal of 80  4.  Have you had nausea?  Tell me about when have experienced nausea and what you did to help?no nausea  5.  Has the frequency or color changed with your urine?urinating frequently dark in am and lighter as day goes on  6.  Tell me what your incisions look like?steri strips in place  7.  Have you been passing gas? BM?yes and had several bms  8.  If a problem or question were to arise who would you call?  Do you know contact numbers for [facility], [facility], and [facility]?aware of how to contact all services  9.  How has the walking going?ambulating hourly and using IS  10.  How are your vitamins and calcium going?  How are you taking them?using opurity MVI at night and 3 calcium during  The day

## 2017-07-13 ENCOUNTER — Encounter: Payer: 59 | Attending: General Surgery | Admitting: Skilled Nursing Facility1

## 2017-07-13 DIAGNOSIS — Z811 Family history of alcohol abuse and dependence: Secondary | ICD-10-CM | POA: Diagnosis not present

## 2017-07-13 DIAGNOSIS — G4733 Obstructive sleep apnea (adult) (pediatric): Secondary | ICD-10-CM | POA: Insufficient documentation

## 2017-07-13 DIAGNOSIS — Z6839 Body mass index (BMI) 39.0-39.9, adult: Secondary | ICD-10-CM | POA: Diagnosis not present

## 2017-07-13 DIAGNOSIS — E7800 Pure hypercholesterolemia, unspecified: Secondary | ICD-10-CM | POA: Insufficient documentation

## 2017-07-13 DIAGNOSIS — Z8349 Family history of other endocrine, nutritional and metabolic diseases: Secondary | ICD-10-CM | POA: Diagnosis not present

## 2017-07-13 DIAGNOSIS — Z8371 Family history of colonic polyps: Secondary | ICD-10-CM | POA: Diagnosis not present

## 2017-07-13 DIAGNOSIS — Z713 Dietary counseling and surveillance: Secondary | ICD-10-CM | POA: Diagnosis not present

## 2017-07-13 DIAGNOSIS — Z8249 Family history of ischemic heart disease and other diseases of the circulatory system: Secondary | ICD-10-CM | POA: Insufficient documentation

## 2017-07-13 DIAGNOSIS — M545 Low back pain: Secondary | ICD-10-CM | POA: Diagnosis not present

## 2017-07-13 DIAGNOSIS — I10 Essential (primary) hypertension: Secondary | ICD-10-CM | POA: Diagnosis not present

## 2017-07-13 DIAGNOSIS — Z87891 Personal history of nicotine dependence: Secondary | ICD-10-CM | POA: Insufficient documentation

## 2017-07-13 DIAGNOSIS — K760 Fatty (change of) liver, not elsewhere classified: Secondary | ICD-10-CM | POA: Insufficient documentation

## 2017-07-16 ENCOUNTER — Telehealth: Payer: Self-pay | Admitting: Registered"

## 2017-07-16 NOTE — Telephone Encounter (Signed)
RD called pt to verify fluid intake once starting soft, solid proteins 2 week post-bariatric surgery. Pt unavailable; left voicemail.    

## 2017-07-16 NOTE — Telephone Encounter (Signed)
RD called pt to verify fluid intake once starting soft, solid proteins 2 week post-bariatric surgery.   Daily Fluid intake: 50-64 ounces Daily Protein intake: 70-80 grams  Concerns/issues: none stated. Pt states he is increasing as he is able.

## 2017-07-19 ENCOUNTER — Encounter: Payer: Self-pay | Admitting: Skilled Nursing Facility1

## 2017-07-19 NOTE — Progress Notes (Signed)
Bariatric Class:  Appt start time: 1530 end time:  1630.  2 Week Post-Operative Nutrition Class  Patient was seen on RYGB for Post-[facility]ative [facility].   Surgery date: 06/29/2017 Surgery type: RYGB Start weight at [facility]: 271.1 Weight today: 258.6  TANITA  BODY COMP RESULTS  07/14/2017   BMI (kg/m^2) 38.2   Fat Mass (lbs) 94.4   Fat Free Mass (lbs) 164.2   Total Body Water (lbs) 123.2   The following the learning objectives were met by the patient during this course:  Identifies Phase 3A (Soft, High Proteins) Dietary Goals and will begin from 2 weeks post-operatively to 2 months post-operatively  Identifies appropriate sources of fluids and proteins   States protein recommendations and appropriate sources post-operatively  Identifies the need for appropriate texture modifications, mastication, and bite sizes when consuming solids  Identifies appropriate multivitamin and calcium sources post-operatively  Describes the need for physical activity post-operatively and will follow MD recommendations  States when to call healthcare provider regarding medication questions or post-operative complications  Handouts given during class include:  Phase 3A: Soft, High Protein Diet Handout  Follow-Up Plan: Patient will follow-up at [facility] in 6 weeks for 2 month post-op nutrition visit for diet advancement per MD.    

## 2017-08-11 ENCOUNTER — Encounter: Payer: 59 | Attending: General Surgery | Admitting: Skilled Nursing Facility1

## 2017-08-11 ENCOUNTER — Encounter: Payer: Self-pay | Admitting: Skilled Nursing Facility1

## 2017-08-11 DIAGNOSIS — E7800 Pure hypercholesterolemia, unspecified: Secondary | ICD-10-CM | POA: Diagnosis not present

## 2017-08-11 DIAGNOSIS — Z8249 Family history of ischemic heart disease and other diseases of the circulatory system: Secondary | ICD-10-CM | POA: Insufficient documentation

## 2017-08-11 DIAGNOSIS — Z6839 Body mass index (BMI) 39.0-39.9, adult: Secondary | ICD-10-CM | POA: Insufficient documentation

## 2017-08-11 DIAGNOSIS — Z8371 Family history of colonic polyps: Secondary | ICD-10-CM | POA: Insufficient documentation

## 2017-08-11 DIAGNOSIS — G4733 Obstructive sleep apnea (adult) (pediatric): Secondary | ICD-10-CM | POA: Insufficient documentation

## 2017-08-11 DIAGNOSIS — Z713 Dietary counseling and surveillance: Secondary | ICD-10-CM | POA: Diagnosis not present

## 2017-08-11 DIAGNOSIS — Z87891 Personal history of nicotine dependence: Secondary | ICD-10-CM | POA: Diagnosis not present

## 2017-08-11 DIAGNOSIS — K760 Fatty (change of) liver, not elsewhere classified: Secondary | ICD-10-CM | POA: Diagnosis not present

## 2017-08-11 DIAGNOSIS — I10 Essential (primary) hypertension: Secondary | ICD-10-CM | POA: Insufficient documentation

## 2017-08-11 DIAGNOSIS — M545 Low back pain: Secondary | ICD-10-CM | POA: Diagnosis not present

## 2017-08-11 DIAGNOSIS — Z8349 Family history of other endocrine, nutritional and metabolic diseases: Secondary | ICD-10-CM | POA: Diagnosis not present

## 2017-08-11 DIAGNOSIS — Z811 Family history of alcohol abuse and dependence: Secondary | ICD-10-CM | POA: Diagnosis not present

## 2017-08-11 NOTE — Progress Notes (Signed)
Follow-up visit:  8 Weeks Post-Operative RYGB Surgery  Primary concerns today: Post-operative Bariatric Surgery Nutrition Management.  Pt arrives doing fabulous! Pt states sometimes eggs sit heavy but a heard boiled egg is a little better.  Pt states he vomited after rice and drinking with it resulting in pain. Pt states he has been eating until satisfaction rather than fullness. Pt states it is very hard not to drink with meals. Pt states he does not care for nuts and seeds. Pt states he has added all foods back in. Pt states he arrived using the steps. Pt states him and his wife weigh every day and he is starting to see where it is causing negativity in his life.   Surgery date: 06/29/2017 Surgery type[facility]YGB [facility] Weight today: 243.4 Weight change: 15.2  TANITA  BODY COMP RESULTS  07/14/2017 08/11/2017   BMI (kg/m^2) 38.2 35.9   Fat Mass (lbs) 94.4 84   Fat Free Mass (lbs) 164.2 159.4   Total Body Water (lbs) 123.2 118    24-hr recall: B (AM): p3 meat and cheese Snk (AM): yogurt L (PM): rolled up lunch meat Snk (PM):  D (PM): ham with mashed potatoes and edameme  Snk (PM):   Fluid intake: water, snapple, gatorade zero: 64+ Estimated total protein intake: 60  Medications: no longer taking lisinopril  Supplementation: opurity and calcium   Using straws: no Drinking while eating: no Having you been chewing well:no Chewing/swallowing difficulties: no Changes in vision: no Changes to mood/headaches: no Hair loss/Cahnges to skin/Changes to nails: no Any difficulty focusing or concentrating: no Sweating: no Dizziness/Lightheaded:  Palpitations: no  Carbonated beverages: no N/V/D/C/GAS: no Abdominal Pain: no Dumping syndrome: no  Recent physical activity:  20-30 minutes of walking   Progress Towards Goal(s):  In progress.  Handouts given during visit include:  Meal ideas   Nutritional Diagnosis:  [place]-3.3 Overweight/obesity related to past  poor dietary habits and physical inactivity as evidenced by patient w/ recent RYGB surgery following dietary guidelines for continued weight loss.  Intervention:  Nutrition counseling. Dietitian educated the pt on the maintainance diet phase.  Goals: -Continue to eat until satisfaction not fullness -For breakfast: energy source and protein, for lunch and dinner: energy sources, protein, non starchy vegetables  -Do not weight more than once a week  Teaching Method Utilized:  Visual Auditory Hands on  Barriers to learning/adherence to lifestyle change: none identified   Demonstrated degree of understanding via:  Teach Back   Monitoring/Evaluation:  Dietary intake, exercise, and body weight.

## 2017-08-24 ENCOUNTER — Ambulatory Visit: Payer: Self-pay | Admitting: Skilled Nursing Facility1

## 2017-11-16 ENCOUNTER — Ambulatory Visit: Payer: Self-pay | Admitting: Skilled Nursing Facility1

## 2017-11-23 ENCOUNTER — Encounter: Payer: Self-pay | Admitting: Skilled Nursing Facility1

## 2017-11-23 ENCOUNTER — Encounter: Payer: 59 | Attending: General Surgery | Admitting: Skilled Nursing Facility1

## 2017-11-23 DIAGNOSIS — E669 Obesity, unspecified: Secondary | ICD-10-CM | POA: Diagnosis present

## 2017-11-23 DIAGNOSIS — Z713 Dietary counseling and surveillance: Secondary | ICD-10-CM | POA: Insufficient documentation

## 2017-11-23 NOTE — Progress Notes (Signed)
Post-Operative RYGB Surgery  Primary concerns today: Post-operative Bariatric Surgery Nutrition Management.  Pt arrives doing fabulous! Pt states he weighs 2 times week. Pt states he wants to join [employer]. Pt states lately he has been napping more lately possibly due to needing to get up earlier for his kids. Pt states he still wears his C-PAP machine. Pt states he sleeps about 7 hours a night but not necessarily rested. Pt states he sees where his weight has been slowing down and recognized he has not been exercising as often and has picked up boredom eating again.   Surgery date: 06/29/2017 Surgery [facility]GB [facility] Weight today: 225.8 Weight change: 17.6  TANITA  BODY COMP RESULTS  07/14/2017 08/11/2017 11/23/2017   BMI (kg/m^2) 38.2 35.9 33.3   Fat Mass (lbs) 94.4 84 70.4   Fat Free Mass (lbs) 164.2 159.4 155.4   Total Body Water (lbs) 123.2 118 112.4    24-hr recall: B (AM): p3 meat and cheese and yogurt Snk (AM): cheesetick L (PM): half sandwich Snk (PM): p3 or strawberries  D (PM): ham or other meats with mashed potatoes and edameme or corn  Snk (PM): couple pretzels    Fluid intake: water, decaf tea  Estimated total protein intake: 60+  Medications: no longer taking lisinopril  Supplementation: opurity and calcium   Using straws: no Drinking while eating: no Having you been chewing well:no Chewing/swallowing difficulties: no Changes in vision: no Changes to mood/headaches: no Hair loss/Cahnges to skin/Changes to nails: no Any difficulty focusing or concentrating: no Sweating: no Dizziness/Lightheaded:  Palpitations: no  Carbonated beverages: no N/V/D/C/GAS: no Abdominal Pain: no Dumping syndrome: no  Recent physical activity:  20-30 minutes of walking; aiming for [id number] steps a day  Progress Towards Goal(s):  In progress.  Handouts given during visit include:  Meal ideas   Nutritional Diagnosis:  [place]-3.3 Overweight/obesity  related to past poor dietary habits and physical inactivity as evidenced by patient w/ recent RYGB surgery following dietary guidelines for continued weight loss.  Intervention:  Nutrition counseling. Dietitian educated the pt on the maintainance diet phase.  Goals: -Continue to eat until satisfaction not fullness -For breakfast: energy source and protein, for lunch and dinner: energy sources, protein, non starchy vegetables  -Great job on identifying what has contributed to your slow down in weight loss -Read and go for a walk before bed   Teaching Method Utilized:  Visual Auditory Hands on  Barriers to learning/adherence to lifestyle change: none identified   Demonstrated degree of understanding via:  Teach Back   Monitoring/Evaluation:  Dietary intake, exercise, and body weight.

## 2018-02-22 ENCOUNTER — Ambulatory Visit: Payer: Self-pay | Admitting: Skilled Nursing Facility1

## 2019-01-07 IMAGING — RF DG UGI W/ KUB
7 of 8 series · 14 of 21 positions shown · non-contrast
Comparison: None.

CLINICAL DATA: Preop gastric sleeve, morbid obesity

EXAM:
UPPER GI SERIES WITH KUB
TECHNIQUE: After obtaining a scout radiograph a routine upper GI series was
performed using thin barium
FLUOROSCOPY TIME:  Fluoroscopy Time:  1 minutes 6 seconds
Radiation Exposure Index (if provided by the fluoroscopic device):
40.6 mGy
Number of Acquired Spot Images: 7

[Series 1: t abdomen supine · 0.15mm/px · 1 of 1 slices shown]
[im 1/1]
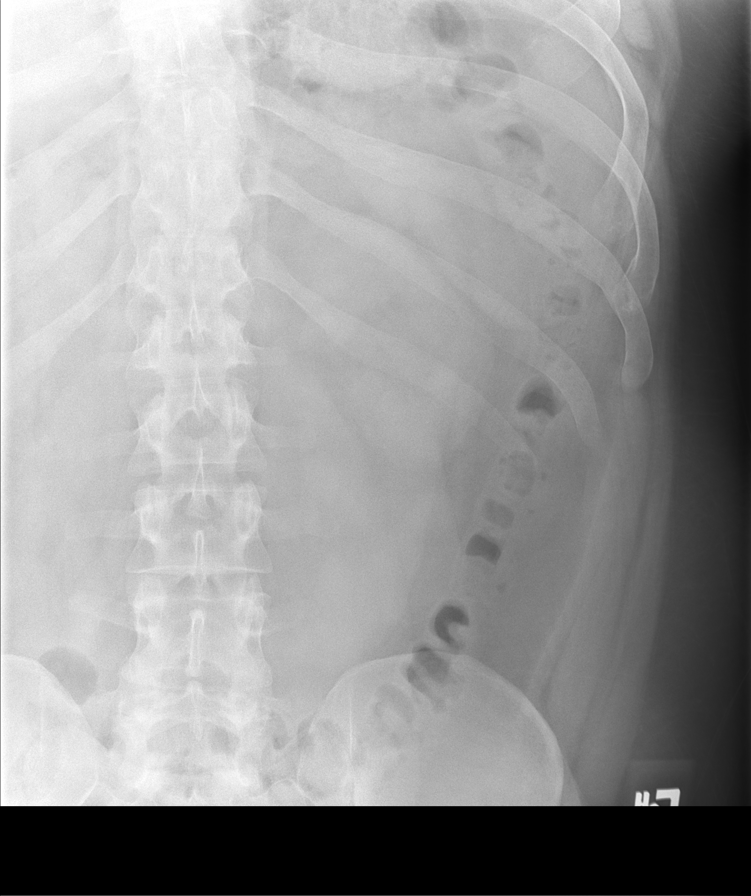

[Series 2: cp_standard · 0.52mm/px · 2 of 49 frames shown (1 of 4)]
[frame 8/49]
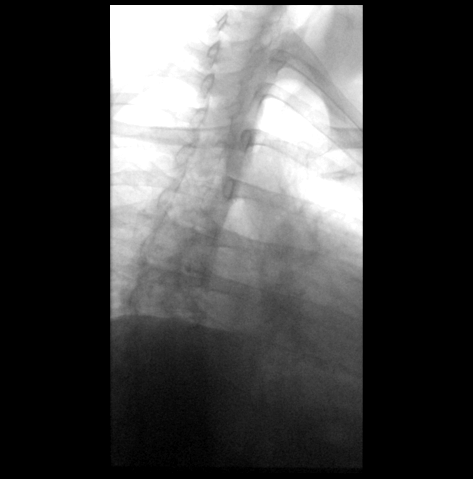
[frame 25/49]
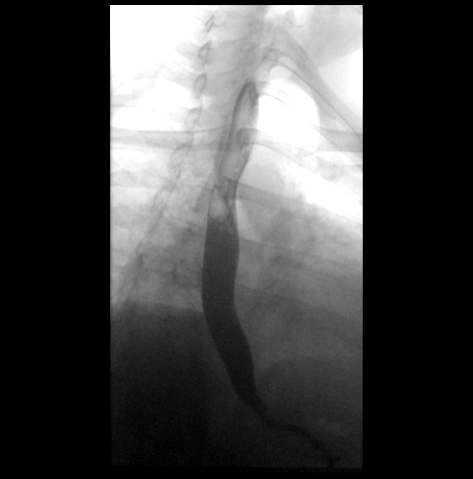

[Series 3: cp_standard · 0.52mm/px · 3 of 57 frames shown (2 of 4)]
[frame 6/57]
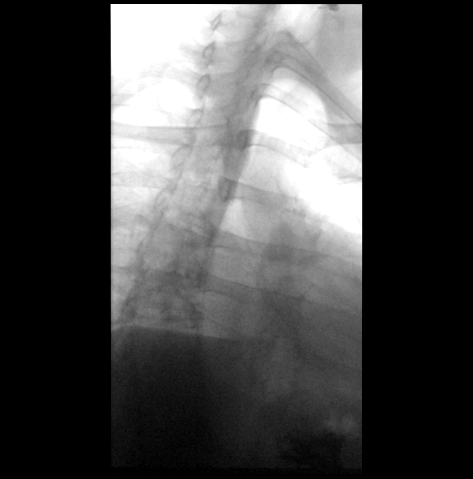
[frame 9/57]
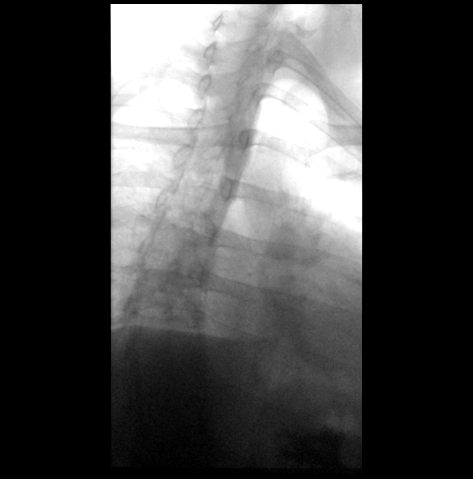
[frame 49/57]
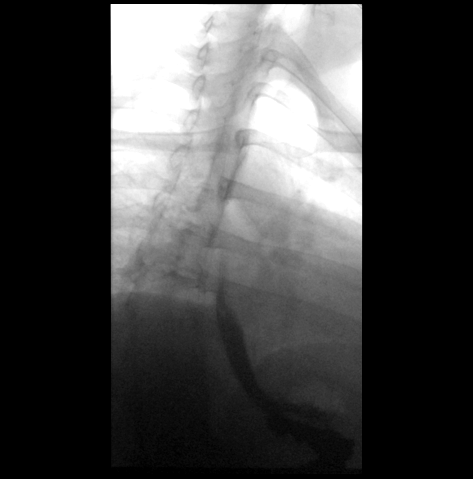

[Series 4: fluoro_barium 2fps_bw · 0.17mm/px · 1 of 2 frames shown (1 of 2)]
[frame 1/2]
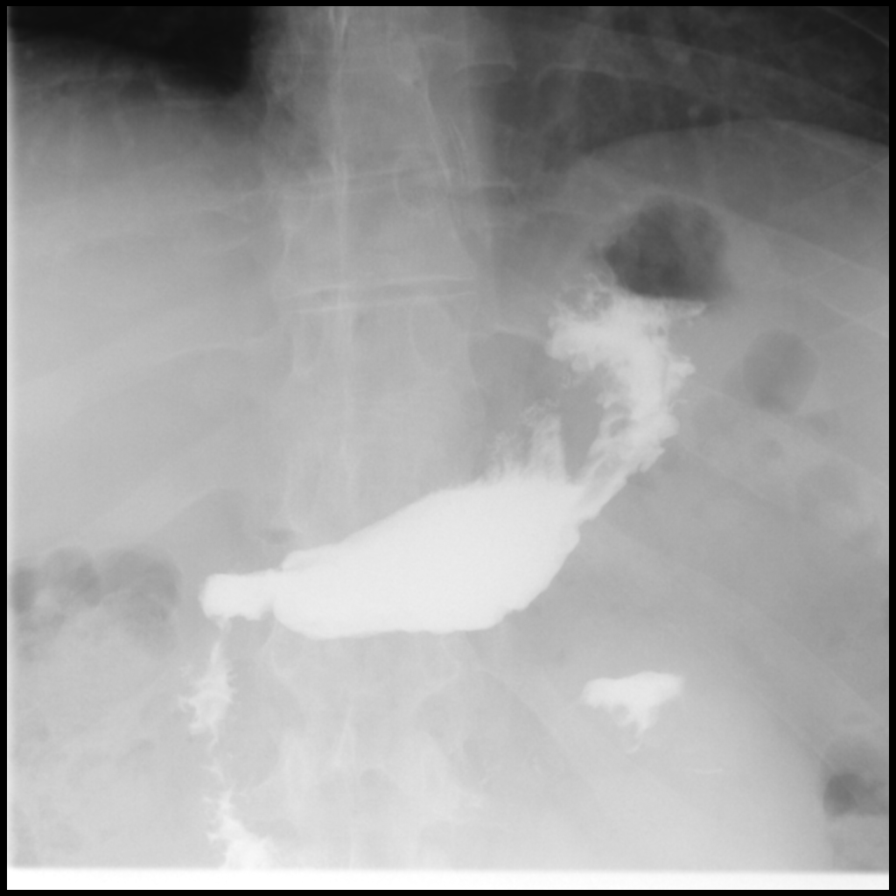

[Series 5: cp_standard · 0.35mm/px · 3 of 39 frames shown (3 of 4)]
[frame 2/39]
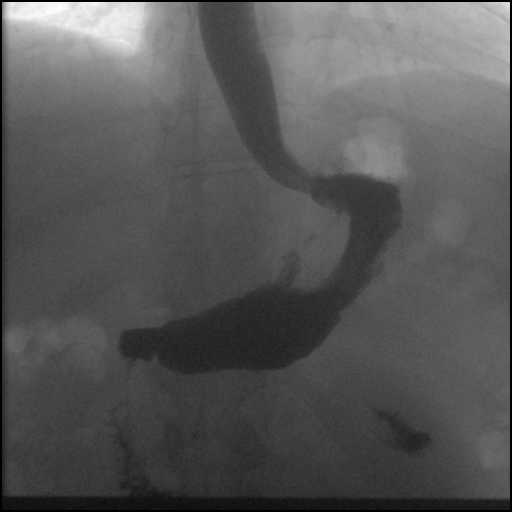
[frame 6/39]
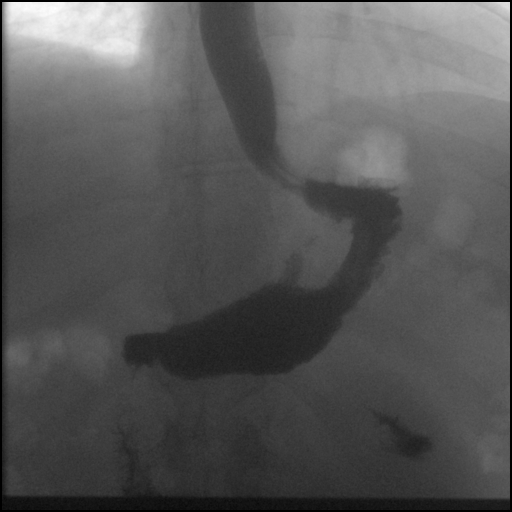
[frame 34/39]
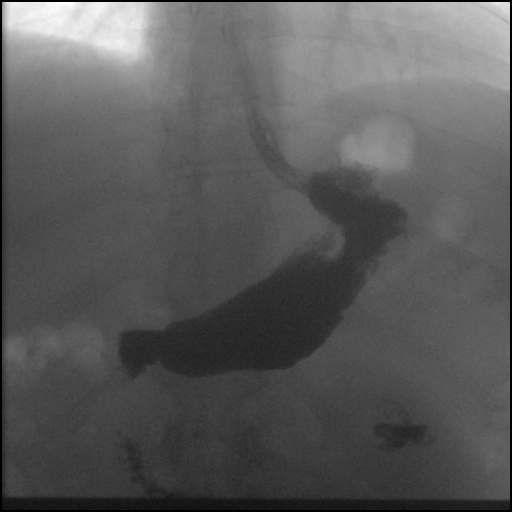

[Series 6: fluoro_barium 2fps_bw · 0.17mm/px · 1 of 1 slices shown (2 of 2)]
[im 1/1]
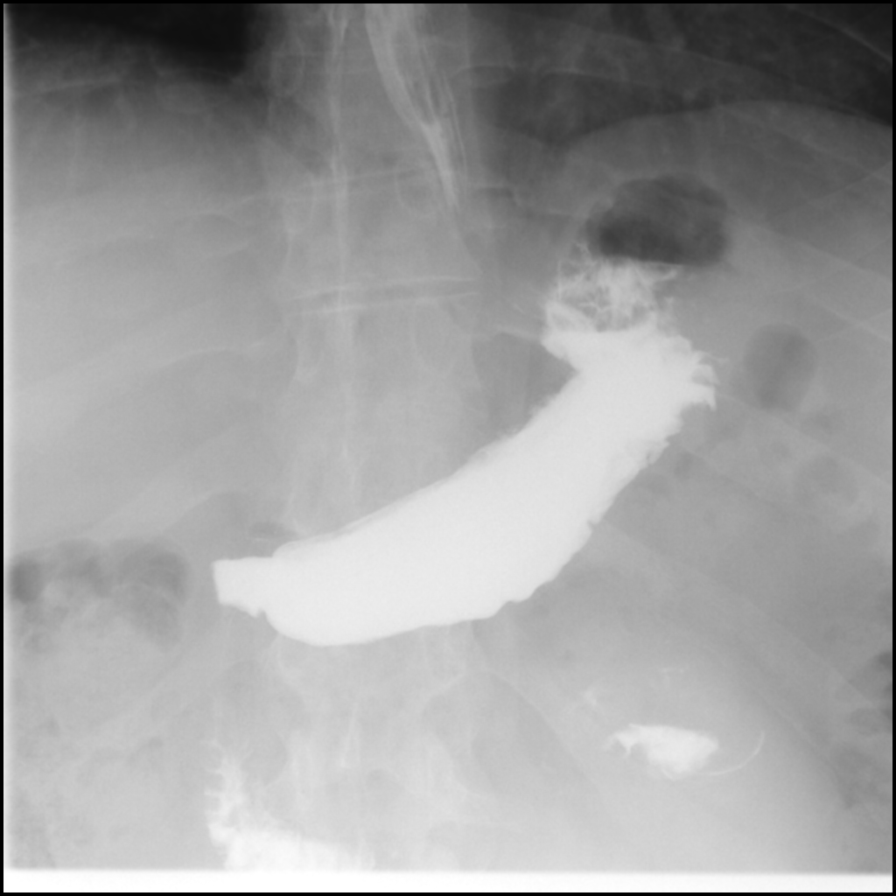

[Series 8: cp_standard · 0.37mm/px · 3 of 5 frames shown (4 of 4)]
[frame 1/5]
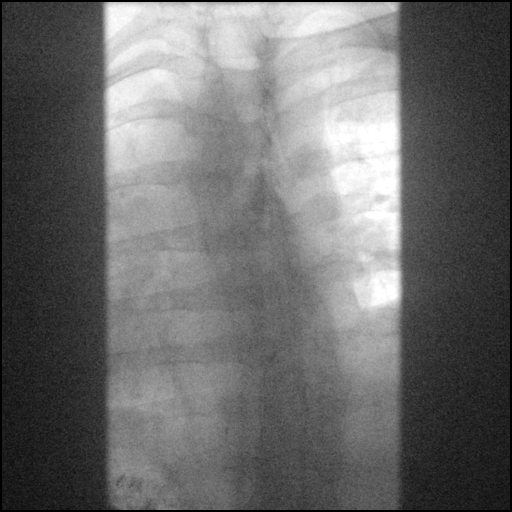
[frame 3/5]
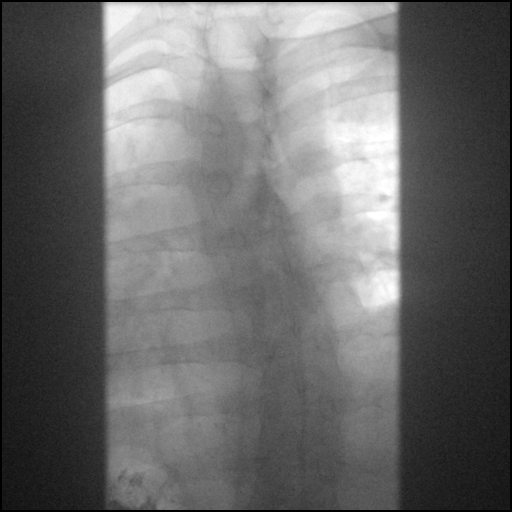
[frame 5/5]
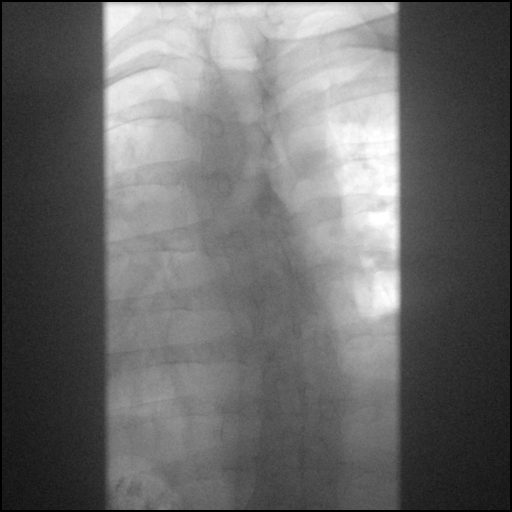

[14 of 21 positions shown; findings below may reference images not displayed]

FINDINGS: Scout radiograph is unremarkable.

Normal esophageal peristalsis. No fixed esophageal narrowing or
stricture.

No gas soft reflux was demonstrated.

No evidence of hiatal hernia.

Normal gastric folds.

Visualized proximal duodenum is unremarkable.
IMPRESSION: Negative upper GI.

## 2019-01-12 ENCOUNTER — Encounter (HOSPITAL_COMMUNITY): Payer: Self-pay
